# Patient Record
Sex: Female | Born: 1955
Health system: Southern US, Community
[De-identification: ages and names within clinical notes are randomized; demographics above are authoritative.]

## PROBLEM LIST (undated history)

## (undated) DIAGNOSIS — I10 Essential (primary) hypertension: Secondary | ICD-10-CM

## (undated) DIAGNOSIS — E785 Hyperlipidemia, unspecified: Secondary | ICD-10-CM

## (undated) DIAGNOSIS — E669 Obesity, unspecified: Secondary | ICD-10-CM

## (undated) DIAGNOSIS — M1990 Unspecified osteoarthritis, unspecified site: Secondary | ICD-10-CM

## (undated) DIAGNOSIS — E079 Disorder of thyroid, unspecified: Secondary | ICD-10-CM

## (undated) HISTORY — PX: LUMBAR FUSION: SHX111

## (undated) HISTORY — PX: EYE SURGERY: SHX253

## (undated) HISTORY — DX: Disorder of thyroid, unspecified: E07.9

## (undated) HISTORY — DX: Obesity, unspecified: E66.9

## (undated) HISTORY — DX: Essential (primary) hypertension: I10

## (undated) HISTORY — DX: Unspecified osteoarthritis, unspecified site: M19.90

## (undated) HISTORY — DX: Hyperlipidemia, unspecified: E78.5

## (undated) HISTORY — PX: FOOT SURGERY: SHX648

---

## 1997-08-08 ENCOUNTER — Other Ambulatory Visit: Admission: RE | Admit: 1997-08-08 | Discharge: 1997-08-08 | Payer: Self-pay | Admitting: Family Medicine

## 1997-08-12 ENCOUNTER — Ambulatory Visit (HOSPITAL_COMMUNITY): Admission: RE | Admit: 1997-08-12 | Discharge: 1997-08-12 | Payer: Self-pay | Admitting: Podiatry

## 1998-09-26 ENCOUNTER — Other Ambulatory Visit: Admission: RE | Admit: 1998-09-26 | Discharge: 1998-09-26 | Payer: Self-pay | Admitting: Family Medicine

## 1999-04-09 ENCOUNTER — Encounter: Payer: Self-pay | Admitting: Neurosurgery

## 1999-04-11 ENCOUNTER — Encounter: Payer: Self-pay | Admitting: Neurosurgery

## 1999-04-12 ENCOUNTER — Inpatient Hospital Stay (HOSPITAL_COMMUNITY): Admission: RE | Admit: 1999-04-12 | Discharge: 1999-04-14 | Payer: Self-pay | Admitting: Neurosurgery

## 1999-05-10 ENCOUNTER — Ambulatory Visit (HOSPITAL_COMMUNITY): Admission: RE | Admit: 1999-05-10 | Discharge: 1999-05-10 | Payer: Self-pay | Admitting: Neurosurgery

## 1999-05-10 ENCOUNTER — Encounter: Payer: Self-pay | Admitting: Neurosurgery

## 1999-07-25 ENCOUNTER — Ambulatory Visit (HOSPITAL_COMMUNITY): Admission: RE | Admit: 1999-07-25 | Discharge: 1999-07-25 | Payer: Self-pay | Admitting: Neurosurgery

## 1999-07-25 ENCOUNTER — Encounter: Payer: Self-pay | Admitting: Neurosurgery

## 1999-08-29 ENCOUNTER — Other Ambulatory Visit: Admission: RE | Admit: 1999-08-29 | Discharge: 1999-08-29 | Payer: Self-pay | Admitting: Emergency Medicine

## 1999-10-16 ENCOUNTER — Encounter: Admission: RE | Admit: 1999-10-16 | Discharge: 1999-10-16 | Payer: Self-pay | Admitting: Family Medicine

## 1999-10-16 ENCOUNTER — Encounter: Payer: Self-pay | Admitting: Family Medicine

## 2001-02-23 ENCOUNTER — Encounter: Payer: Self-pay | Admitting: Family Medicine

## 2001-02-23 ENCOUNTER — Encounter: Admission: RE | Admit: 2001-02-23 | Discharge: 2001-02-23 | Payer: Self-pay | Admitting: Family Medicine

## 2001-04-23 ENCOUNTER — Encounter: Admission: RE | Admit: 2001-04-23 | Discharge: 2001-04-23 | Payer: Self-pay | Admitting: Family Medicine

## 2001-04-23 ENCOUNTER — Encounter: Payer: Self-pay | Admitting: Family Medicine

## 2001-05-05 ENCOUNTER — Encounter (INDEPENDENT_AMBULATORY_CARE_PROVIDER_SITE_OTHER): Payer: Self-pay | Admitting: Specialist

## 2001-05-05 ENCOUNTER — Ambulatory Visit (HOSPITAL_COMMUNITY): Admission: RE | Admit: 2001-05-05 | Discharge: 2001-05-05 | Payer: Self-pay | Admitting: *Deleted

## 2002-10-11 ENCOUNTER — Ambulatory Visit (HOSPITAL_COMMUNITY): Admission: RE | Admit: 2002-10-11 | Discharge: 2002-10-11 | Payer: Self-pay | Admitting: Gastroenterology

## 2003-08-01 ENCOUNTER — Other Ambulatory Visit: Admission: RE | Admit: 2003-08-01 | Discharge: 2003-08-01 | Payer: Self-pay | Admitting: Family Medicine

## 2003-08-15 ENCOUNTER — Encounter: Admission: RE | Admit: 2003-08-15 | Discharge: 2003-08-15 | Payer: Self-pay | Admitting: Family Medicine

## 2004-08-08 ENCOUNTER — Other Ambulatory Visit: Admission: RE | Admit: 2004-08-08 | Discharge: 2004-08-08 | Payer: Self-pay | Admitting: Family Medicine

## 2004-10-09 ENCOUNTER — Encounter: Admission: RE | Admit: 2004-10-09 | Discharge: 2004-10-09 | Payer: Self-pay | Admitting: Family Medicine

## 2005-09-03 ENCOUNTER — Other Ambulatory Visit: Admission: RE | Admit: 2005-09-03 | Discharge: 2005-09-03 | Payer: Self-pay | Admitting: Family Medicine

## 2005-10-11 ENCOUNTER — Encounter: Admission: RE | Admit: 2005-10-11 | Discharge: 2005-10-11 | Payer: Self-pay | Admitting: Family Medicine

## 2006-10-13 ENCOUNTER — Encounter: Admission: RE | Admit: 2006-10-13 | Discharge: 2006-10-13 | Payer: Self-pay | Admitting: Family Medicine

## 2006-10-31 ENCOUNTER — Other Ambulatory Visit: Admission: RE | Admit: 2006-10-31 | Discharge: 2006-10-31 | Payer: Self-pay | Admitting: Family Medicine

## 2006-11-25 ENCOUNTER — Encounter: Admission: RE | Admit: 2006-11-25 | Discharge: 2006-11-25 | Payer: Self-pay | Admitting: Neurosurgery

## 2007-01-27 ENCOUNTER — Inpatient Hospital Stay (HOSPITAL_COMMUNITY): Admission: RE | Admit: 2007-01-27 | Discharge: 2007-01-29 | Payer: Self-pay | Admitting: Neurosurgery

## 2007-07-02 ENCOUNTER — Encounter: Admission: RE | Admit: 2007-07-02 | Discharge: 2007-07-02 | Payer: Self-pay | Admitting: Neurosurgery

## 2007-10-23 ENCOUNTER — Encounter: Admission: RE | Admit: 2007-10-23 | Discharge: 2007-10-23 | Payer: Self-pay | Admitting: Family Medicine

## 2007-12-25 ENCOUNTER — Other Ambulatory Visit: Admission: RE | Admit: 2007-12-25 | Discharge: 2007-12-25 | Payer: Self-pay | Admitting: Family Medicine

## 2009-01-11 ENCOUNTER — Encounter: Admission: RE | Admit: 2009-01-11 | Discharge: 2009-01-11 | Payer: Self-pay | Admitting: Family Medicine

## 2009-01-25 ENCOUNTER — Other Ambulatory Visit: Admission: RE | Admit: 2009-01-25 | Discharge: 2009-01-25 | Payer: Self-pay | Admitting: Family Medicine

## 2010-02-01 ENCOUNTER — Encounter
Admission: RE | Admit: 2010-02-01 | Discharge: 2010-02-01 | Payer: Self-pay | Source: Home / Self Care | Attending: Family Medicine | Admitting: Family Medicine

## 2010-05-29 NOTE — Op Note (Signed)
NAME:  Margaret Hancock, Margaret Hancock                ACCOUNT NO.:  [id number]   MEDICAL RECORD NO.:  [id number]          PATIENT TYPE:  INP   LOCATION:  3172                         FACILITY:  [facility]   PHYSICIAN:  Sherilyn Cooter A. Pool, M.D.    DATE OF BIRTH:  [birth year]   DATE OF PROCEDURE:  01/27/2007  DATE OF DISCHARGE:                               OPERATIVE REPORT   PREOPERATIVE DIAGNOSIS:  L3-4 degenerative disk disease with  spondylolisthesis and lateral listhesis representing instability, L3-4  stenosis.  Status post L4-5 posterolateral arthrodesis with  instrumentation and L4-5 posterior lumbar interbody fusion.   POSTOPERATIVE DIAGNOSIS:  L3-4 degenerative disk disease with  spondylolisthesis and lateral listhesis representing instability, L3-4  stenosis.  Status post L4-5 posterolateral arthrodesis with  instrumentation and L4-5 posterior lumbar interbody fusion.   PROCEDURE NOTE:  Re-exploration of L4-5 fusion with removal of L4 and L5  hardware.  L3-4 re-exploration of laminectomy with complete  decompressive laminectomy including bilateral L3-L4 decompressive  foraminotomies, more than would be required for simple interbody fusion  alone.  L3-4 posterior lumbar utilizing tangent interbody allograft  wedge,Telamon interbody PEEK cage and local autografting.  L3-4  posterolateral arthrodesis utilizing nonsegmental pedicle  instrumentation and local autografting.   SURGEON:  Kathaleen Maser. Pool, M.D.   ASSISTANT:  Tia Alert, M.D.   ANESTHESIA:  General endotracheal.   PREMEDICATION:  Ms. Orson Slick is a 55 year old female status post previous  L4-5 decompression and fusion approximately eight years ago.  The  patient presents now with a intractable back pain with lower extremity  symptoms.  Patient has failed all conservative management.  She has  demonstrated evidence of instability at the level above her fusion.  She  presents now for decompression and fusion at L3-4 level.  This will  require  hardware removal and re-exploration of her previous fusion.   OPERATIVE NOTE:  The patient placed on the operative table in the supine  position. Adequate level of anesthesia was achieved.  The patient was  positioned prone onto a Wilson frame, appropriately padded.  The  patient's lumbar region was prepped and draped sterilely.  A 10 blade  was used to make a linear skin incision overlying the L3, L4 and L5  levels.  This was carried down sharply in the midline.  A subperiosteal  dissection was then performed exposing lamina and facet joint at L2-3  and the transverse processes of L3, the posterolateral fusion of L4-5  including instrumentation.  The previously placed STRS Instrumentation  was then disassembled.  The pedicle screws were removed.  The fusion at  L4-5 was explored and found to be solid.  Screws were left out at L5.  A  6.75 x 40-mm radius screws were placed bilaterally at L4 through the  previously made pedicle screw holes.  Decompressive laminectomy was then  performed at L3-4.  The patient's previous laminectomy defect was  dissected free.  Complete laminectomy of L3 was then performed using  Leksell rongeurs, Kerrison rongeurs, and high-speed drill.  All elements  of the lamina were completely removed.  Complete inferior facetectomies  were  performed bilaterally at L3.  Complete superior facetectomies of L4  were performed bilaterally.  All bone was cleaned and used in later  autograft.  Underlying thecal sac and exiting L3-L4 nerve roots were  identified.  Wide decompressive foraminotomies were then performed along  the course of the exiting nerve roots.  Epidural venous plexus was  coagulated and cut. __________  patient's left side.  Thecal sac and  nerve roots gently mobilized and retracted towards midline.  Disk space  was then incised with a 15 blade in rectangular fashion.  Wide disk  space clean out was achieved using pituitary rongeurs, upbiting  pituitary  rongeurs and Epstein curettes.  Procedure was then repeated on  the contralateral side.  Disk space was then sequentially dilated up to  8 mm with an 8-mm distractor left in the patient's left side.  Thecal  sac and nerve root was retracted on the right side.  Disk space then  reamed and then cut with 8-mm tangent instrument.  Soft tissues removed  from the interspace.  A 8n x 22-mm Telamon cage packed with morselized  autograft and Progenix putty was then impacted into place and recessed  approximately 2 mm the posterior cortical margin of L3.  The thecal sac  and nerve roots were protected on the left side.  Disk space was then  reamed and cut with 8-mm tangent instruments.  Soft tissue removed from  the interspace.  Disk space was further curettaged.  Morselized  autograft was then packed into the interspace.  An 8 x 26-mm tangent  wedge then impacted into place and recessed roughly 1-mm from the  posterior vertical margin of L3.  Pedicles at L3 were then identified  using surface landmarks and intraoperative fluoroscopy.  Superficial  bone around the pedicle was then removed using high-speed drill.  Each  pedicle was then probed using pedicle awl.  An awl track was then tapped  with a 5.25-mm screw tapper.  Each screw tap hole was probed and found  be solid in bone.  A 6.75 x 45-mm radius screws were placed bilaterally  at L3.  Transverse processes of L3 and L4 were then decorticated using a  high speed drill.  Morselized autograft was packed posterolaterally for  later fusion.  Short segment titanium rod was then placed over the screw  heads at L4 and L5.  Locking caps were placed over the screw heads.  The  locking caps were then engaged with construct under compression.  Final  images revealed good position of the bone grafts and hardware at proper  operative level with normal alignment of spine.  Wound was then  irrigated one final time.  Gelfoam was placed topically for  hemostasis.  A medium Hemovac drain was left as a temporary spacer.  Wound was then  closed in layers with Vicryl suture.  Steri-Strips and sterile dressing  were applied.  There were no complications.  The patient tolerated the  procedure well, and she returned to the recovery room for postoperative  care.           ______________________________  Kathaleen Maser. Pool, M.D.     HAP/MEDQ  D:  01/27/2007  T:  01/27/2007  Job:  [id number]

## 2010-06-01 NOTE — Op Note (Signed)
[facility]  Patient:    Margaret Hancock, Margaret Hancock Visit Number: [id number] MRN: [id number]          Service Type: DSU Location: [facility] Attending Physician:  Ardeen Fillers Dictated by:   Sung Amabile. Roslyn Smiling, M.D. Proc. Date: 05/05/01 Admit Date:  05/05/2001                             Operative Report  INDICATIONS:                  This 55 year old woman G 1, P 1, with continued bleeding since December.  Menses have been irregular over the past year.  She reports a pre-menopausal FSH level which was drawn in December 2002.  She is hypothyroid, but recent thyroid study tests were normal.  An ultrasound at [facility] recently showed an endometrial stripe of 1.1 cm, and no endometrial mass.  She has had no anemia.  She was given 10 days of Provera recently, and had heavier bleeding, but the bleeding has not abated.  Attempts to do a biopsy of the endometrium in the office were unsuccessful, because of cervical stenosis and also obstruction of the cervical os by a large peduculated Nabothian cyst. She is admitted now for a D&C and excision of the cyst.  PREOPERATIVE DIAGNOSES:       1. Abnormal uterine bleeding, with cervical                               stenosis.                               2. Large nabothian cyst, obstructing the                               cervical os.  POSTOPERATIVE DIAGNOSES:      1. Abnormal uterine bleeding, with cervical                               stenosis.                               2. Large nabothian cyst, obstructing the                               cervical os.  PROCEDURE:                    1. Dilatation and curettage.                               2. Excision of nabothian cyst.  SURGEON:                      Sung Amabile. Roslyn Smiling, M.D.  ANESTHESIA:                   General.  Anesthesia via LMA, paracervical block.  ESTIMATED BLOOD LOSS:         Less than 50 cc.  TUBES/DRAINS:  None.  COMPLICATIONS:                 None.  FINDINGS:                     The uterus was anteverted, normal size, nontender.  The uterine cavity sounded to 8.0 cm.  The os slightly stenotic. A 2.0 cm nabothian cyst, pedunculated, obstructing the os.  Some irregularity of the posterior wall, curettage.  SPECIMENS:                    Endometrial curettings to pathology.  DESCRIPTION OF PROCEDURE:     After the establishment of general anesthesia, the patient was placed in the dorsal lithotomy position.  The perineum and vagina were prepped with Betadine solution.  The bladder was evacuated.  the patient was draped.  Examination under anesthesia for the above findings was carried out.  No adnexal masses were noted.  The Graves speculum was inserted in vagina.  The cervix was reprepped with Betadine solution.  The anterior cervical lip was infiltrated with 1% Xylocaine.  A paracervical block was placed in the usual fashion with 20 cc of 1% Xylocaine.  The uterus was sounded to 8.0 cm.  The cervix was dilated using Pratt dilators to a 27-French.  Sharp curettage was carried out.  The large nabothian cyst was identified and grasped.  It was grasped at the base with two right angle clamps and then excised sharply.  The defect was closed with two interrupted sutures of #0 Vicryl.  Hemostasis was noted.  The instruments were removed.  The patient was returned to the supine position, extubate without difficulty, and transported to the recovery room in satisfactory condition. Dictated by:   Sung Amabile Roslyn Smiling, M.D. Attending Physician:  Ardeen Fillers DD:  05/05/01 TD:  05/05/01 Job: [id number]

## 2010-06-01 NOTE — Op Note (Signed)
   NAME:  Margaret Hancock, Margaret Hancock                          ACCOUNT NO.:  [id number]   MEDICAL RECORD NO.:  [id number]                   PATIENT TYPE:  AMB   LOCATION:  ENDO                                 FACILITY:  [facility]   PHYSICIAN:  John C. Madilyn Fireman, M.D.                 DATE OF BIRTH:  Dec 25, 1955   DATE OF PROCEDURE:  10/11/2002  DATE OF DISCHARGE:                                 OPERATIVE REPORT   PROCEDURE:  Colonoscopy.   INDICATIONS FOR PROCEDURE:  Family history of colon cancer in a first degree  relative.   DESCRIPTION OF PROCEDURE:  The patient was placed in the left lateral  decubitus position then placed on the pulse monitor with continuous low flow  oxygen delivered by nasal cannula. She was sedated with 75 mcg IV fentanyl  and 8 mg Versed. The Olympus video colonoscope was inserted into the rectum  and advanced to the cecum, confirmed by transillumination at McBurney's  point and visualization of the ileocecal valve and appendiceal orifice. The  prep was excellent. The cecum, ascending, transverse, descending and sigmoid  colon all appeared normal with no masses, polyps, diverticula or other  mucosal abnormalities. The rectum likewise appeared normal and retroflexed  view of the anus revealed no obvious internal hemorrhoids. The scope was  then withdrawn and the patient returned to the recovery room in stable  condition. She tolerated the procedure well and there were no immediate  complications.   IMPRESSION:  Normal colonoscopy.   PLAN:  Repeat study in five years.                                               John C. Madilyn Fireman, M.D.    JCH/MEDQ  D:  10/11/2002  T:  10/11/2002  Job:  [id number]   cc:   Otilio Connors. Gerri Spore, M.D.  [address]  [place]  [place] [address]  Fax: [phone]

## 2010-06-01 NOTE — Op Note (Signed)
[place]  Patient:    Margaret Hancock, Margaret Hancock                       MRN: [id number] Proc. Date: 04/11/99 Adm. Date:  [id number] Attending:  Donn Pierini                           Operative Report  PREOPERATIVE DIAGNOSIS:  Right L4-5 synovial cyst.  L4-5 chronic instability.  PROCEDURE:  L4-5 decompressive laminectomy with resection of a synovial cyst. Bilateral L4-5 foraminotomies.  Posterior lumbar interbody fusion utilizing Tangent threaded bone wedges and local autograft.  Posterolateral fusion with pedicle screw instrumentation and local autograft.   Microdissection.  SURGEON:  Julio Sicks, M.D.  ASSISTANT:  Reinaldo Meeker, M.D.  ANESTHESIA:  General endotracheal.  INDICATIONS:  Ms. Margaret Hancock is a 55 year old female with history of back and right lower extremity pain consistent with a right L5 radiculopathy, which has failed  efforts at conservative management.  MRI scanning demonstrates a right-sided L5  nerve root synovial cyst.  There is also evidence of high ______ within both facet joints.  There were ______ changes in the disk space at L4-5.  The patient has been counselled as to her options.  We discussed the possibility of undergoing simple decompression with resection of the cyst versus decompression and fusion.  I have explained there is a relatively high recurrence rate with simple decompression alone.  She has opted for the more definitive procedure and has decided to proceed with lumbar decompression and fusion.  OPERATIVE NOTE:  Patient taken to the operating room, placed on operating table in supine position.  After adequate level of anesthesia achieved, patient positioned prone onto a Wilson frame, appropriately padded.  The patients lumbar region was shaved and prepped sterilely.  A #10 blade was used to make a linear skin incision overlying the L3, L4 and L5 levels.  This carried down sharply in the  midline. A subperiosteal dissection then performed bilaterally, exposing the lamina and facet joints of L4 and L5, as well as, lamina and facet joints of L3-4.  The transverse processes of L4 and L5 were then dissected free.  A deep self-retaining retractor was placed.  Intraoperative fluoroscopy was used and the L4-5 level was confirmed. A decompressive laminectomy was then performed using Leksell rongeurs, high-speed drill and Kerrison rongeurs to remove the entire lamina at L4.  The superior one-third of the lamina of L5.  The ligamentum flavum between L3 and L4 was also resected with Kerrison rongeurs.  Wide medial facetectomy was then performed bilaterally using Leksell rongeurs and Kerrison rongeurs and high-speed drill to remove the entire inferior facet of L4 and the medial two-thirds of the L5 superior facet.  The underlying thecal sac and exiting L4 and L5 nerve roots were identified bilaterally.  The synovial cyst was then dissected free from the underlying thecal sac and exiting L5 nerve root.  This was done with microdissection under the microscope.  The synovial cyst was completely removed and the right L5 nerve root was totally decompressed.  Still using the microscope, the epidural venous plexus was coagulated and cut.  The thecal sac and exiting L5 nerve root were then mobilized and retracted towards the midline.  The disk space, starting first on the patients right side, was then incised with 15 blade in a rectangular fashion.  wide disk space clean out was then achieved using pituitary  rongeurs, upward-angled pituitary rongeurs and Epstein curettes.  A 10 mm distractor was then placed in the interspace and attention was then placed to the contralateral side.  Once again, the disk space was uncovered and the epidural venous plexus was coagulated and ut. Thecal sac and exiting L5 nerve root were retracted and protected towards the midline.  The disk space  was then incised and a wide, aggressive diskectomy was  once again performed.  An 11 mm distractor was then placed in this side.  A 10 m distractor was removed.  Starting on the patients right side, the disk space was then reamed and then cut with a medium chisel for appropriation of placement of the Tangent bone graft.  Disk space was cleaned and irrigated with antibiotic solution. A 10 x 20 mm Tangent wedge was then placed in the interspace.  Inserter was removed.  The distractor from the contralateral side was removed.  Attention was then placed to the left side.  Thecal sac and exiting L5 nerve root were once again protected, as was the exiting L4 nerve root above.  The disk space was then reamed and then cut with a medium chisel once again.  The disk space was completely scraped of all soft material.  Morcellized autograft was then packed into the interspace.  A 10 x 20 mm Tangent wedge was then impacted into place once again. Inserter was removed.  The wound was then copiously irrigated with antibiotic solution.  The exiting foramen were found to be free.  The final images with the fluoroscopy revealed good positioning of bone grafts in the L4-5 interspace with normal alignment of the spine.  Microscope was removed and preparation was made for placement of pedicle screw instrumentation.   The pedicles were isolated at L4 nd L5 bilaterally.  The superficial cortices were removed using Leksell rongeur and high-speed drill.  The pedicles were then probed using a pedicle awl.  Each pedicle was then tapped using a 5.25 mm screw tap.  All these maneuvers were made under  fluoroscopic guidance.  All screw tap holes were found to be solidly within bone using blunt probe.  A 6.75 x 40 mm variable headed SDRS screw was then placed at the L4 and L5 levels bilaterally.  The screws were placed under fluoroscopic guidance and found to be well positioned.  A short segment of titanium  rod was hen placed over the screw heads of L4 and L5.  Locking caps were then placed over the screw heads.  Locking caps were then engaged in a sequential fashion in order to place the construct under compression.  Final images of C-arm fluoroscopy revealed  good position of the bone grafts and hardware at the proper operative level with normal alignment of the spine.  This was done in both the lateral and A/P planes. The wound was then copiously irrigated with antibiotic solution.  Transverse processes of L4 and L5 were then decorticated using the high-speed drill. Morcellized autograft was then packed posterolaterally along the transverse processes and lateral facet joints.  The thecal sac and exiting L4 and L5 nerve  roots were inspected.  There was no evidence of any compression.  Gelfoam was placed topically for hemostasis, which is found to be good.  Retractor system was removed.  Hemostasis of muscle achieved with electrocautery.  A medium Hemovac as left in the epidural space.  The wound was then closed in layers with Vicryl sutures.  Staples were applied to the surface.  There were no apparent complications.  The patient tolerated the procedure well and she returns to recovery room postoperatively. DD:  04/11/99 TD:  04/11/99 Job: [id number]

## 2010-06-01 NOTE — H&P (Signed)
[facility]  PatientIVELISSE, [name] Visit Number: [id number] MRN: [id number]          Service Type: Attending:  Sung Amabile. Roslyn Smiling, M.D. Dictated by:   Sung Amabile Roslyn Smiling, M.D. Adm. Date:  05/05/01                           History and Physical  CHIEF COMPLAINT:              Abnormal uterine bleeding for four months. Nabothian cyst obstructing cervical os.  HISTORY OF PRESENT ILLNESS:   A 55 year old woman, G1, P1, with continual bleeding since December.  Menses have been irregular over the last year.  She reports that [facility] in the premenopausal range of December 2002.  She is hyperthyroid, but has recent thyroid studies that were normal.  Ultrasound at [facility] recently showed an endometrial strip of 1.1 cm and no endometrial mass. Hemoglobin has been good.  She was given 10 days of Provera recently and has had heavier bleeding, but it has not abated.  Attempts to biopsy her endometrium in the office were unsuccessful because of cervical stenosis and also because of obstruction of the cervical os by a large pedunculated Nabothian cyst.  She is admitted for [facility] and excision of the Nabothian cyst.  PAST MEDICAL HISTORY:         1. Hypothyroidism, for the last 1-1/2 years.                               2. Hypercholesterolemia.                               3. Hypertension.  SURGERIES:                    1. Lumbar disk surgery.                               2. Cesarean section, 1984.  MEDICATIONS:                  1. Bisoprl/HCTZ 25/6.25 mg q.d.                               2. Lipitor 10 mg q.d.                               3. Multivitamin.                               4. Levothyroid 75 mcg one tablet p.o. q.d.                               5. Fluoxetine 20 mg q.d.  ALLERGIES:                    None.  FAMILY HISTORY:               Mother has hypertension and died of an MI at age 79.  Father with CVA, hypertension and prostatic and colon cancer; died in  his 78s.  Siblings and child are alive and well.  SOCIAL HISTORY:               Married.  Professor at [employer].  Denies tobacco use.  Drinks alcohol occasionally.  PHYSICAL EXAMINATION:  VITAL SIGNS:                  Healthy-appearing woman, weight 213 pounds, height 5 feet 3 inches, blood pressure 110/68.  HEENT:                        Within normal limits.  NECK:                         Without thyromegaly.  CHEST:                        Clear.  CARDIAC:                      Regular rate and rhythm; S1, S2 normal.  BREASTS:                      Without mass or tenderness, axillary or supraclavicular nodes.  ABDOMEN:                      Soft and nontender, without organomegaly mass or hernia.  Well-healed Pfannenstiel incision.  BACK:                         Without CVA tenderness.  EXTREMITIES:                  No clubbing, cyanosis or edema.  GU:                           Vagina without lesion.  Heme in vaginal vault. Cervix:  Os obstructed by 2 cm pedunculated Nabothian cyst.  Uterus top normal-sized, anteverted and nontender.  Mobile.  Adnexa normal to palpation. Rectovaginal exam confirmatory.  SKIN:                         Without lesions.  NEUROLOGIC:                   Grossly intact.  LABS:                         Recent hemoglobin in 13 range.  ASSESSMENT/PLAN:              1. Abnormal uterine bleeding.                               2. Pedunculated nabothian cyst, obstructing                                  ability to perform endometrial biopsy.                               3. Hypertension.  4. Hypothyroid - euthyroid recently with                                  medication.                               5. Hypercholesterolemia.                               6. Obesity.  D&C and excision of nabothian cyst.  The patient has been counseled regarding the benefits, risks and options and expected outcome of both procedures  prior to surgery. Dictated by:   Sung Amabile Roslyn Smiling, M.D. Attending:  Sung Amabile. Roslyn Smiling, M.D. DD:  04/30/01 TD:  04/30/01 Job: [id number]

## 2010-10-04 LAB — BASIC METABOLIC PANEL
Calcium: 9.5
GFR calc Af Amer: >60
GFR calc non Af Amer: >60
Sodium: 138

## 2010-10-04 LAB — DIFFERENTIAL
Basophils Absolute: 0
Basophils Relative: 1
Eosinophils Relative: 1
Lymphocytes Relative: 29
Lymphs Abs: 1.6
Neutro Abs: 3.6

## 2010-10-04 LAB — PROTIME-INR
INR: 0.9
Prothrombin Time: 12.1

## 2010-10-04 LAB — TYPE AND SCREEN: Antibody Screen: NEGATIVE

## 2010-10-04 LAB — CBC
MCHC: 34.8
RBC: 4.42
RDW: 13.4
WBC: 5.5

## 2011-01-28 ENCOUNTER — Other Ambulatory Visit: Payer: Self-pay | Admitting: Family Medicine

## 2011-01-28 DIAGNOSIS — Z1231 Encounter for screening mammogram for malignant neoplasm of breast: Secondary | ICD-10-CM

## 2011-02-12 ENCOUNTER — Ambulatory Visit
Admission: RE | Admit: 2011-02-12 | Discharge: 2011-02-12 | Disposition: A | Discharge status: Home / Self Care | Payer: BC Managed Care – PPO | Source: Ambulatory Visit | Attending: Family Medicine | Admitting: Family Medicine

## 2011-02-12 DIAGNOSIS — Z1231 Encounter for screening mammogram for malignant neoplasm of breast: Secondary | ICD-10-CM

## 2012-01-20 ENCOUNTER — Other Ambulatory Visit: Payer: Self-pay | Admitting: Family Medicine

## 2012-01-20 DIAGNOSIS — Z1231 Encounter for screening mammogram for malignant neoplasm of breast: Secondary | ICD-10-CM

## 2012-02-14 ENCOUNTER — Ambulatory Visit
Admission: RE | Admit: 2012-02-14 | Discharge: 2012-02-14 | Disposition: A | Discharge status: Home / Self Care | Payer: BC Managed Care – PPO | Source: Ambulatory Visit | Attending: Family Medicine | Admitting: Family Medicine

## 2012-02-14 DIAGNOSIS — Z1231 Encounter for screening mammogram for malignant neoplasm of breast: Secondary | ICD-10-CM

## 2012-10-02 ENCOUNTER — Other Ambulatory Visit (HOSPITAL_COMMUNITY)
Admission: RE | Admit: 2012-10-02 | Discharge: 2012-10-02 | Disposition: A | Discharge status: Home / Self Care | Payer: BC Managed Care – PPO | Source: Ambulatory Visit | Attending: Family Medicine | Admitting: Family Medicine

## 2012-10-02 ENCOUNTER — Other Ambulatory Visit: Payer: Self-pay | Admitting: Family Medicine

## 2012-10-02 ENCOUNTER — Encounter: Payer: Self-pay | Admitting: Cardiology

## 2012-10-02 DIAGNOSIS — Z1151 Encounter for screening for human papillomavirus (HPV): Secondary | ICD-10-CM | POA: Insufficient documentation

## 2012-10-02 DIAGNOSIS — Z124 Encounter for screening for malignant neoplasm of cervix: Secondary | ICD-10-CM | POA: Insufficient documentation

## 2012-11-06 ENCOUNTER — Other Ambulatory Visit: Payer: Self-pay | Admitting: Family Medicine

## 2012-11-06 DIAGNOSIS — I10 Essential (primary) hypertension: Secondary | ICD-10-CM

## 2012-11-09 ENCOUNTER — Encounter: Payer: Self-pay | Admitting: Cardiology

## 2012-11-11 ENCOUNTER — Ambulatory Visit: Payer: BC Managed Care – PPO | Admitting: Cardiology

## 2012-11-11 DIAGNOSIS — I10 Essential (primary) hypertension: Secondary | ICD-10-CM | POA: Insufficient documentation

## 2012-11-11 DIAGNOSIS — M47817 Spondylosis without myelopathy or radiculopathy, lumbosacral region: Secondary | ICD-10-CM | POA: Insufficient documentation

## 2012-11-13 ENCOUNTER — Ambulatory Visit
Admission: RE | Admit: 2012-11-13 | Discharge: 2012-11-13 | Disposition: A | Discharge status: Home / Self Care | Payer: BC Managed Care – PPO | Source: Ambulatory Visit | Attending: Family Medicine | Admitting: Family Medicine

## 2012-11-13 DIAGNOSIS — I10 Essential (primary) hypertension: Secondary | ICD-10-CM

## 2012-11-16 ENCOUNTER — Ambulatory Visit (INDEPENDENT_AMBULATORY_CARE_PROVIDER_SITE_OTHER): Payer: BC Managed Care – PPO | Admitting: Cardiology

## 2012-11-16 ENCOUNTER — Encounter: Payer: Self-pay | Admitting: Cardiology

## 2012-11-16 VITALS — BP 156/97 | HR 94 | Ht 62.5 in | Wt 220.0 lb

## 2012-11-16 DIAGNOSIS — I10 Essential (primary) hypertension: Secondary | ICD-10-CM

## 2012-11-16 DIAGNOSIS — R0681 Apnea, not elsewhere classified: Secondary | ICD-10-CM

## 2012-11-16 DIAGNOSIS — R0609 Other forms of dyspnea: Secondary | ICD-10-CM

## 2012-11-16 DIAGNOSIS — R0683 Snoring: Secondary | ICD-10-CM

## 2012-11-16 NOTE — Progress Notes (Signed)
HPI The patient presents for evaluation of difficult to control hypertension. She's had some blood pressure issues for quite a while but has had a recent exacerbation of her blood pressure and it has been more difficult to control. She was taking Ziac but was recently changed to amlodipine with hydrochlorothiazide. Her blood pressures have been running consistently in the 180s and sometimes higher with diastolics in the 90s and sometimes higher. More recently it started to come down a little bit. She did have a renal ultrasound and I reviewed these results and this was normal. She has not had any other dramatic change in her health although there's been a little more stress with a sick mother-in-law. She has her thyroid checked routinely and this has been normal. She has had no dramatic change in her weight. She does exercise although she hasn't been able to do this frequently because of some leg pain. She was trainer in an elliptical. She denies any chest pressure, neck or arm discomfort. She's had no palpitations, presyncope or syncope. She has had no PND or orthopnea. She does snore and wakes up not feeling rested.  Allergies  Allergen Reactions  . Anesthetics, Ester Nausea Only    Laughing gas    Current Outpatient Prescriptions  Medication Sig Dispense Refill  . aspirin 81 MG tablet Take 81 mg by mouth daily.      . calcium carbonate (OS-CAL) 600 MG TABS tablet Take 600 mg by mouth 2 (two) times daily with a meal.      . dimenhyDRINATE (DRAMAMINE) 50 MG tablet Take 50 mg by mouth every 8 (eight) hours as needed.      . hydrochlorothiazide (MICROZIDE) 12.5 MG capsule Take 12.5 mg by mouth daily.      Marland Kitchen levothyroxine (SYNTHROID, LEVOTHROID) 125 MCG tablet Take 125 mcg by mouth daily before breakfast.      . Multiple Vitamin (MULTIVITAMIN) tablet Take 1 tablet by mouth daily.      Marland Kitchen omeprazole (PRILOSEC) 20 MG capsule Take 20 mg by mouth daily.      . simvastatin (ZOCOR) 80 MG tablet Take 80  mg by mouth at bedtime.      . traMADol (ULTRAM) 50 MG tablet Take 50 mg by mouth every 6 (six) hours as needed for pain.      . vitamin E 1000 UNIT capsule Take 1,000 Units by mouth daily.       No current facility-administered medications for this visit.    Past Medical History  Diagnosis Date  . Hyperlipidemia   . Thyroid disease   . Hypertension   . Obesity   . Osteoarthritis   . OA (osteoarthritis)     No past surgical history on file.  No family history on file.  History   Social History  . Marital Status: Married    Spouse Name: N/A    Number of Children: N/A  . Years of Education: N/A   Occupational History  . Not on file.   Social History Main Topics  . Smoking status: Never Smoker   . Smokeless tobacco: Not on file  . Alcohol Use: Yes  . Drug Use: Not on file  . Sexual Activity: Yes   Other Topics Concern  . Not on file   Social History Narrative  . No narrative on file    ROS:  As stated in the HPI and negative for all other systems.   PHYSICAL EXAM There were no vitals taken for this visit. GENERAL:  Well appearing HEENT:  Pupils equal round and reactive, fundi not visualized, oral mucosa unremarkable NECK:  No jugular venous distention, waveform within normal limits, carotid upstroke brisk and symmetric, no bruits, no thyromegaly LYMPHATICS:  No cervical, inguinal adenopathy LUNGS:  Clear to auscultation bilaterally BACK:  No CVA tenderness CHEST:  Unremarkable HEART:  PMI not displaced or sustained,S1 and S2 within normal limits, no S3, no S4, no clicks, no rubs, no murmurs ABD:  Flat, positive bowel sounds normal in frequency in pitch, no bruits, no rebound, no guarding, no midline pulsatile mass, no hepatomegaly, no splenomegaly EXT:  2 plus pulses throughout, no edema, no cyanosis no clubbing SKIN:  No rashes no nodules NEURO:  Cranial nerves II through XII grossly intact, motor grossly intact throughout PSYCH:  Cognitively intact,  oriented to person place and time   EKG:  Normal sinus rhythm, rate 85, axis within normal limits, intervals within normal limits, no acute ST-T wave changes.  11/16/2012   ASSESSMENT AND PLAN  HTN:  I don't strongly suspect a secondary etiology. She could however have sleep apnea and this will be worked up as below. Because she had a recent change her medications I won't make a change today but she will keep a blood pressure diary. We discussed specific therapeutic lifestyle changes to include weight loss, increase activity as tolerated, decreased alcohol and salt intake. If she can employ these we should see the blood pressure come down on the current regimen. I will see her back in one month however to followup on this.  SNORING:  I suspect she has sleep apnea. I will set her up for a sleep study.  TACHYCARDIA:  She describes her heart beating hard but not fast. She does think the heart rate overall is higher than it used to be. I will consider following up with a monitor in the future. I will consider 24 hour urine for VMA and metanephrine if blood pressure doesn't come down.

## 2012-11-16 NOTE — Patient Instructions (Signed)
The current medical regimen is effective;  continue present plan and medications.  Your physician has recommended that you have a sleep study. This test records several body functions during sleep, including: brain activity, eye movement, oxygen and carbon dioxide blood levels, heart rate and rhythm, breathing rate and rhythm, the flow of air through your mouth and nose, snoring, body muscle movements, and chest and belly movement.  Follow up with Dr Antoine Poche in 1 month.

## 2012-11-17 DIAGNOSIS — I10 Essential (primary) hypertension: Secondary | ICD-10-CM | POA: Insufficient documentation

## 2012-12-14 ENCOUNTER — Ambulatory Visit (HOSPITAL_BASED_OUTPATIENT_CLINIC_OR_DEPARTMENT_OTHER): Payer: BC Managed Care – PPO | Attending: Cardiology

## 2012-12-14 VITALS — Ht 62.0 in | Wt 220.0 lb

## 2012-12-14 DIAGNOSIS — R0681 Apnea, not elsewhere classified: Secondary | ICD-10-CM

## 2012-12-14 DIAGNOSIS — R0683 Snoring: Secondary | ICD-10-CM

## 2012-12-14 DIAGNOSIS — G4733 Obstructive sleep apnea (adult) (pediatric): Secondary | ICD-10-CM

## 2012-12-20 DIAGNOSIS — R0609 Other forms of dyspnea: Secondary | ICD-10-CM

## 2012-12-20 DIAGNOSIS — R0681 Apnea, not elsewhere classified: Secondary | ICD-10-CM

## 2012-12-20 DIAGNOSIS — G4733 Obstructive sleep apnea (adult) (pediatric): Secondary | ICD-10-CM

## 2012-12-20 DIAGNOSIS — R0989 Other specified symptoms and signs involving the circulatory and respiratory systems: Secondary | ICD-10-CM

## 2012-12-20 NOTE — Sleep Study (Signed)
[facility]  NAME: Margaret Hancock DATE OF BIRTH:  [birth year] MEDICAL RECORD NUMBER [id number]  LOCATION: [facility]  PHYSICIAN: Barbaraann Share  DATE OF STUDY: 12/14/2012  SLEEP STUDY TYPE: Nocturnal Polysomnogram               REFERRING PHYSICIAN: Rollene Rotunda, MD  INDICATION FOR STUDY: hypersomnia with sleep apnea  EPWORTH SLEEPINESS SCORE:  9 HEIGHT: 5\' 2"  (157.5 cm)  WEIGHT: 220 lb (99.791 kg)    Body mass index is 40.23 kg/(m^2).  NECK SIZE: 14 in.  MEDICATIONS:   SLEEP ARCHITECTURE: The patient had a total sleep time of 212 minutes, with no slow-wave sleep and only 44 minutes of REM. Sleep onset latency was normal at 25 minutes, and REM onset was normal as well. Sleep efficiency was poor at 58%.  RESPIRATORY DATA: The patient had one obstructive apnea, and 57 obstructive hypopneas, giving him an AHI of 17 events per hour. The events occurred in all body positions, and there was loud snoring noted throughout. The patient did not meet split-night protocol secondary to the majority of his events occurring well after 1 AM.  OXYGEN DATA: There was oxygen desaturation as low as 78% with the patient's obstructive events  CARDIAC DATA: Occasional PAC noted, but no clinically significant arrhythmias were seen  MOVEMENT/PARASOMNIA: No significant leg jerks or other behavioral abnormalities were noted.  IMPRESSION/ RECOMMENDATION:    1) mild to moderate obstructive sleep apnea, with an AHI of 17 events per hour and oxygen desaturation as low as 78%. Treatment for this degree of sleep apnea should focus on a trial of weight loss alone, upper airway surgery, dental appliance, and finally CPAP. Clinical correlation is suggested.  2) occasional PAC noted, but no clinically significant arrhythmias were seen.     Barbaraann Share Diplomate, American Board of Sleep Medicine  ELECTRONICALLY SIGNED ON:  12/20/2012, 4:20 PM [facility] PH: [phone]   FX: [phone] ACCREDITED BY THE AMERICAN ACADEMY OF SLEEP MEDICINE

## 2012-12-29 ENCOUNTER — Other Ambulatory Visit: Payer: Self-pay | Admitting: Gastroenterology

## 2012-12-31 ENCOUNTER — Ambulatory Visit (INDEPENDENT_AMBULATORY_CARE_PROVIDER_SITE_OTHER): Payer: BC Managed Care – PPO | Admitting: Cardiology

## 2012-12-31 ENCOUNTER — Encounter: Payer: Self-pay | Admitting: Cardiology

## 2012-12-31 ENCOUNTER — Encounter (INDEPENDENT_AMBULATORY_CARE_PROVIDER_SITE_OTHER): Payer: Self-pay

## 2012-12-31 VITALS — BP 136/84 | HR 86 | Ht 62.5 in | Wt 217.6 lb

## 2012-12-31 DIAGNOSIS — I10 Essential (primary) hypertension: Secondary | ICD-10-CM

## 2012-12-31 NOTE — Progress Notes (Signed)
HPI The patient presents for evaluation of difficult to control hypertension. Since I last saw her the stress in her household has gone down. She was caring for her mother-in-law who is quite ill.  However, she died.  This was a sad event but they are easing back back into a routine in the stress level is decreased. She has tracked her blood pressure it also has gone down. She's not feeling of pounding in her heart any longer. She denies any chest pressure, neck or arm discomfort. She's having no shortness of breath, PND or orthopnea. She has had no weight gain or edema.  Allergies  Allergen Reactions  . Anesthetics, Ester Nausea Only    Laughing gas    Current Outpatient Prescriptions  Medication Sig Dispense Refill  . acetaminophen (TYLENOL) 500 MG tablet Take 500 mg by mouth as needed for pain.      Marland Kitchen amLODipine (NORVASC) 10 MG tablet Take 10 mg by mouth daily.      Marland Kitchen aspirin 81 MG tablet Take 81 mg by mouth daily.      Marland Kitchen CALCIUM-VITAMIN D PO Take 500 mg by mouth daily.      Marland Kitchen DIPHENHYDRAMINE HCL, SLEEP, PO Take by mouth. Take one tablet of Zquil daily for sleep      . hydrochlorothiazide (HYDRODIURIL) 25 MG tablet Take 25 mg by mouth daily.      Marland Kitchen levothyroxine (SYNTHROID, LEVOTHROID) 125 MCG tablet Take 125 mcg by mouth daily before breakfast.      . lisinopril (PRINIVIL,ZESTRIL) 10 MG tablet Take 10 mg by mouth daily.      . Multiple Vitamin (MULTIVITAMIN) tablet Take 1 tablet by mouth daily.      Marland Kitchen omeprazole (PRILOSEC) 20 MG capsule Take 20 mg by mouth daily.      . simvastatin (ZOCOR) 80 MG tablet Take 80 mg by mouth at bedtime.      . traMADol (ULTRAM) 50 MG tablet Take 50 mg by mouth every 8 (eight) hours as needed.       . vitamin E 400 UNIT capsule Take 400 Units by mouth daily.       No current facility-administered medications for this visit.    Past Medical History  Diagnosis Date  . Hyperlipidemia   . Thyroid disease   . Hypertension   . Obesity   . Osteoarthritis    . OA (osteoarthritis)     Past Surgical History  Procedure Laterality Date  . Lumbar fusion      x 2  . Foot surgery    . Cesarean section     ROS:  As stated in the HPI and negative for all other systems.   PHYSICAL EXAM BP 136/84  Pulse 86  Ht 5' 2.5" (1.588 m)  Wt 217 lb 9.6 oz (98.703 kg)  BMI 39.14 kg/m2 GENERAL:  Well appearing NECK:  No jugular venous distention, waveform within normal limits, carotid upstroke brisk and symmetric, no bruits, no thyromegaly LUNGS:  Clear to auscultation bilaterally CHEST:  Unremarkable HEART:  PMI not displaced or sustained,S1 and S2 within normal limits, no S3, no S4, no clicks, no rubs, no murmurs ABD:  Flat, positive bowel sounds normal in frequency in pitch, no bruits, no rebound, no guarding, no midline pulsatile mass, no hepatomegaly, no splenomegaly EXT:  2 plus pulses throughout, no edema, no cyanosis no clubbing   ASSESSMENT AND PLAN  HTN:  The blood pressure is at target. No change in medications is indicated. We will  continue with therapeutic lifestyle changes (TLC).  SNORING:  She had mild sleep apnea and we will treat this conservatively.   TACHYCARDIA:  This seems to have resolved with the reduced stress. No change in therapy is indicated.   RISK REDUCTION:  Her 10 year cardiovascular risk is less than 5%. I would not suggest an indication for aspirin based on this.  She needs primary risk reduction with lifestyle changes.

## 2012-12-31 NOTE — Patient Instructions (Signed)
Please stop your Asprin. Continue all other medications as needed.  Follow up as needed.

## 2013-02-15 ENCOUNTER — Other Ambulatory Visit (HOSPITAL_BASED_OUTPATIENT_CLINIC_OR_DEPARTMENT_OTHER): Payer: Self-pay | Admitting: Family Medicine

## 2013-02-15 DIAGNOSIS — Z1231 Encounter for screening mammogram for malignant neoplasm of breast: Secondary | ICD-10-CM

## 2013-02-22 ENCOUNTER — Ambulatory Visit (HOSPITAL_BASED_OUTPATIENT_CLINIC_OR_DEPARTMENT_OTHER)
Admission: RE | Admit: 2013-02-22 | Discharge: 2013-02-22 | Disposition: A | Discharge status: Home / Self Care | Payer: BC Managed Care – PPO | Source: Ambulatory Visit | Attending: Family Medicine | Admitting: Family Medicine

## 2013-02-22 DIAGNOSIS — Z1231 Encounter for screening mammogram for malignant neoplasm of breast: Secondary | ICD-10-CM | POA: Insufficient documentation

## 2013-06-18 DIAGNOSIS — M51369 Other intervertebral disc degeneration, lumbar region without mention of lumbar back pain or lower extremity pain: Secondary | ICD-10-CM | POA: Insufficient documentation

## 2013-06-18 DIAGNOSIS — M47816 Spondylosis without myelopathy or radiculopathy, lumbar region: Secondary | ICD-10-CM | POA: Insufficient documentation

## 2013-06-18 DIAGNOSIS — M1612 Unilateral primary osteoarthritis, left hip: Secondary | ICD-10-CM | POA: Insufficient documentation

## 2013-11-12 DIAGNOSIS — M25559 Pain in unspecified hip: Secondary | ICD-10-CM | POA: Insufficient documentation

## 2013-11-12 DIAGNOSIS — M25552 Pain in left hip: Secondary | ICD-10-CM | POA: Insufficient documentation

## 2014-06-06 ENCOUNTER — Other Ambulatory Visit (HOSPITAL_BASED_OUTPATIENT_CLINIC_OR_DEPARTMENT_OTHER): Payer: Self-pay | Admitting: Family Medicine

## 2014-06-06 DIAGNOSIS — Z1231 Encounter for screening mammogram for malignant neoplasm of breast: Secondary | ICD-10-CM

## 2014-06-10 ENCOUNTER — Ambulatory Visit (HOSPITAL_BASED_OUTPATIENT_CLINIC_OR_DEPARTMENT_OTHER)
Admission: RE | Admit: 2014-06-10 | Discharge: 2014-06-10 | Disposition: A | Discharge status: Home / Self Care | Payer: BC Managed Care – PPO | Source: Ambulatory Visit | Attending: Family Medicine | Admitting: Family Medicine

## 2014-06-10 DIAGNOSIS — Z1231 Encounter for screening mammogram for malignant neoplasm of breast: Secondary | ICD-10-CM | POA: Insufficient documentation

## 2014-08-09 NOTE — Patient Instructions (Addendum)
Margaret Hancock  08/09/2014   Your procedure is scheduled on: Tuesday 08/16/2014  Report to [facility] Main  Entrance take [place]  elevators to 3rd floor to  Short Stay Center at   0830  AM.  Call this number if you have problems the morning of surgery [phone]   Remember: ONLY 1 PERSON MAY GO WITH YOU TO SHORT STAY TO GET  READY MORNING OF YOUR SURGERY.  Do not eat food or drink liquids :After Midnight.     Take these medicines the morning of surgery with A SIP OF WATER:  Levothyroxine                               You may not have any metal on your body including hair pins and              piercings  Do not wear jewelry, make-up, lotions, powders or perfumes, deodorant             Do not wear nail polish.  Do not shave  48 hours prior to surgery.              Men may shave face and neck.   Do not bring valuables to the hospital. [facility] IS NOT             RESPONSIBLE   FOR VALUABLES.  Contacts, dentures or bridgework may not be worn into surgery.  Leave suitcase in the car. After surgery it may be brought to your room.     Patients discharged the day of surgery will not be allowed to drive home.  Name and phone number of your driver:  Special Instructions: N/A              Please read over the following fact sheets you were given: _____________________________________________________________________             [facility] - Preparing for Surgery Before surgery, you can play an important role.  Because skin is not sterile, your skin needs to be as free of germs as possible.  You can reduce the number of germs on your skin by washing with CHG (chlorahexidine gluconate) soap before surgery.  CHG is an antiseptic cleaner which kills germs and bonds with the skin to continue killing germs even after washing. Please DO NOT use if you have an allergy to CHG or antibacterial soaps.  If your skin becomes reddened/irritated stop using the CHG and inform your  nurse when you arrive at Short Stay. Do not shave (including legs and underarms) for at least 48 hours prior to the first CHG shower.  You may shave your face/neck. Please follow these instructions carefully:  1.  Shower with CHG Soap the night before surgery and the  morning of Surgery.  2.  If you choose to wash your hair, wash your hair first as usual with your  normal  shampoo.  3.  After you shampoo, rinse your hair and body thoroughly to remove the  shampoo.                           4.  Use CHG as you would any other liquid soap.  You can apply chg directly  to the skin and wash  Gently with a scrungie or clean washcloth.  5.  Apply the CHG Soap to your body ONLY FROM THE NECK DOWN.   Do not use on face/ open                           Wound or open sores. Avoid contact with eyes, ears mouth and genitals (private parts).                       Wash face,  Genitals (private parts) with your normal soap.             6.  Wash thoroughly, paying special attention to the area where your surgery  will be performed.  7.  Thoroughly rinse your body with warm water from the neck down.  8.  DO NOT shower/wash with your normal soap after using and rinsing off  the CHG Soap.                9.  Pat yourself dry with a clean towel.            10.  Wear clean pajamas.            11.  Place clean sheets on your bed the night of your first shower and do not  sleep with pets. Day of Surgery : Do not apply any lotions/deodorants the morning of surgery.  Please wear clean clothes to the hospital/surgery center.  FAILURE TO FOLLOW THESE INSTRUCTIONS MAY RESULT IN THE CANCELLATION OF YOUR SURGERY PATIENT SIGNATURE_________________________________  NURSE SIGNATURE__________________________________  ________________________________________________________________________   Adam Phenix  An incentive spirometer is a tool that can help keep your lungs clear and active. This tool  measures how well you are filling your lungs with each breath. Taking long deep breaths may help reverse or decrease the chance of developing breathing (pulmonary) problems (especially infection) following:  A long period of time when you are unable to move or be active. BEFORE THE PROCEDURE   If the spirometer includes an indicator to show your best effort, your nurse or respiratory therapist will set it to a desired goal.  If possible, sit up straight or lean slightly forward. Try not to slouch.  Hold the incentive spirometer in an upright position. INSTRUCTIONS FOR USE   Sit on the edge of your bed if possible, or sit up as far as you can in bed or on a chair.  Hold the incentive spirometer in an upright position.  Breathe out normally.  Place the mouthpiece in your mouth and seal your lips tightly around it.  Breathe in slowly and as deeply as possible, raising the piston or the ball toward the top of the column.  Hold your breath for 3-5 seconds or for as long as possible. Allow the piston or ball to fall to the bottom of the column.  Remove the mouthpiece from your mouth and breathe out normally.  Rest for a few seconds and repeat Steps 1 through 7 at least 10 times every 1-2 hours when you are awake. Take your time and take a few normal breaths between deep breaths.  The spirometer may include an indicator to show your best effort. Use the indicator as a goal to work toward during each repetition.  After each set of 10 deep breaths, practice coughing to be sure your lungs are clear. If you have an incision (the cut made at the time of surgery),  support your incision when coughing by placing a pillow or rolled up towels firmly against it. Once you are able to get out of bed, walk around indoors and cough well. You may stop using the incentive spirometer when instructed by your caregiver.  RISKS AND COMPLICATIONS  Take your time so you do not get dizzy or light-headed.  If  you are in pain, you may need to take or ask for pain medication before doing incentive spirometry. It is harder to take a deep breath if you are having pain. AFTER USE  Rest and breathe slowly and easily.  It can be helpful to keep track of a log of your progress. Your caregiver can provide you with a simple table to help with this. If you are using the spirometer at home, follow these instructions: [place] IF:   You are having difficultly using the spirometer.  You have trouble using the spirometer as often as instructed.  Your pain medication is not giving enough relief while using the spirometer.  You develop fever of 100.5 F (38.1 C) or higher. SEEK IMMEDIATE MEDICAL CARE IF:   You cough up bloody sputum that had not been present before.  You develop fever of 102 F (38.9 C) or greater.  You develop worsening pain at or near the incision site. MAKE SURE YOU:   Understand these instructions.  Will watch your condition.  Will get help right away if you are not doing well or get worse. Document Released: 05/13/2006 Document Revised: 03/25/2011 Document Reviewed: 07/14/2006 [employer] Patient Information 2014 [employer], [place].   ________________________________________________________________________  WHAT IS A BLOOD TRANSFUSION? Blood Transfusion Information  A transfusion is the replacement of blood or some of its parts. Blood is made up of multiple cells which provide different functions.  Red blood cells carry oxygen and are used for blood loss replacement.  White blood cells fight against infection.  Platelets control bleeding.  Plasma helps clot blood.  Other blood products are available for specialized needs, such as hemophilia or other clotting disorders. BEFORE THE TRANSFUSION  Who gives blood for transfusions?   Healthy volunteers who are fully evaluated to make sure their blood is safe. This is blood bank blood. Transfusion therapy is the  safest it has ever been in the practice of medicine. Before blood is taken from a donor, a complete history is taken to make sure that person has no history of diseases nor engages in risky social behavior (examples are intravenous drug use or sexual activity with multiple partners). The donor's travel history is screened to minimize risk of transmitting infections, such as malaria. The donated blood is tested for signs of infectious diseases, such as HIV and hepatitis. The blood is then tested to be sure it is compatible with you in order to minimize the chance of a transfusion reaction. If you or a relative donates blood, this is often done in anticipation of surgery and is not appropriate for emergency situations. It takes many days to process the donated blood. RISKS AND COMPLICATIONS Although transfusion therapy is very safe and saves many lives, the main dangers of transfusion include:   Getting an infectious disease.  Developing a transfusion reaction. This is an allergic reaction to something in the blood you were given. Every precaution is taken to prevent this. The decision to have a blood transfusion has been considered carefully by your caregiver before blood is given. Blood is not given unless the benefits outweigh the risks. AFTER THE TRANSFUSION  Right after receiving a blood transfusion, you will usually feel much better and more energetic. This is especially true if your red blood cells have gotten low (anemic). The transfusion raises the level of the red blood cells which carry oxygen, and this usually causes an energy increase.  The nurse administering the transfusion will monitor you carefully for complications. HOME CARE INSTRUCTIONS  No special instructions are needed after a transfusion. You may find your energy is better. Speak with your caregiver about any limitations on activity for underlying diseases you may have. SEEK MEDICAL CARE IF:   Your condition is not improving  after your transfusion.  You develop redness or irritation at the intravenous (IV) site. SEEK IMMEDIATE MEDICAL CARE IF:  Any of the following symptoms occur over the next 12 hours:  Shaking chills.  You have a temperature by mouth above 102 F (38.9 C), not controlled by medicine.  Chest, back, or muscle pain.  People around you feel you are not acting correctly or are confused.  Shortness of breath or difficulty breathing.  Dizziness and fainting.  You get a rash or develop hives.  You have a decrease in urine output.  Your urine turns a dark color or changes to pink, red, or brown. Any of the following symptoms occur over the next 10 days:  You have a temperature by mouth above 102 F (38.9 C), not controlled by medicine.  Shortness of breath.  Weakness after normal activity.  The white part of the eye turns yellow (jaundice).  You have a decrease in the amount of urine or are urinating less often.  Your urine turns a dark color or changes to pink, red, or brown. Document Released: 12/29/1999 Document Revised: 03/25/2011 Document Reviewed: 08/17/2007 [facility] Patient Information 2014 [place], [place].  _______________________________________________________________________

## 2014-08-10 ENCOUNTER — Encounter (HOSPITAL_COMMUNITY): Payer: Self-pay

## 2014-08-10 ENCOUNTER — Encounter (HOSPITAL_COMMUNITY)
Admission: RE | Admit: 2014-08-10 | Discharge: 2014-08-10 | Disposition: A | Discharge status: Home / Self Care | Payer: BC Managed Care – PPO | Source: Ambulatory Visit | Attending: Orthopedic Surgery | Admitting: Orthopedic Surgery

## 2014-08-10 DIAGNOSIS — Z01810 Encounter for preprocedural cardiovascular examination: Secondary | ICD-10-CM | POA: Insufficient documentation

## 2014-08-10 DIAGNOSIS — M1612 Unilateral primary osteoarthritis, left hip: Secondary | ICD-10-CM | POA: Insufficient documentation

## 2014-08-10 DIAGNOSIS — Z01812 Encounter for preprocedural laboratory examination: Secondary | ICD-10-CM | POA: Insufficient documentation

## 2014-08-10 LAB — CBC
HCT: 43 % (ref 36.0–46.0)
Hemoglobin: 14.1 g/dL (ref 12.0–15.0)
MCH: 30.8 pg (ref 26.0–34.0)
MCHC: 32.8 g/dL (ref 30.0–36.0)
MCV: 93.9 fL (ref 78.0–100.0)
PLATELETS: 196 10*3/uL (ref 150–400)
RBC: 4.58 MIL/uL (ref 3.87–5.11)
RDW: 12.9 % (ref 11.5–15.5)
WBC: 5.2 10*3/uL (ref 4.0–10.5)

## 2014-08-10 LAB — SURGICAL PCR SCREEN
MRSA, PCR: NEGATIVE
Staphylococcus aureus: POSITIVE — AB

## 2014-08-10 LAB — BASIC METABOLIC PANEL
ANION GAP: 9 (ref 5–15)
BUN: 17 mg/dL (ref 6–20)
CALCIUM: 9.2 mg/dL (ref 8.9–10.3)
CO2: 30 mmol/L (ref 22–32)
Chloride: 101 mmol/L (ref 101–111)
Creatinine, Ser: 0.75 mg/dL (ref 0.44–1.00)
GFR calc Af Amer: >60 mL/min (ref >60)
GFR calc non Af Amer: >60 mL/min (ref >60)
GLUCOSE: 79 mg/dL (ref 65–99)
POTASSIUM: 3.4 mmol/L — AB (ref 3.5–5.1)
Sodium: 140 mmol/L (ref 135–145)

## 2014-08-10 LAB — URINALYSIS, ROUTINE W REFLEX MICROSCOPIC
BILIRUBIN URINE: NEGATIVE
Glucose, UA: NEGATIVE mg/dL
HGB URINE DIPSTICK: NEGATIVE
KETONES UR: NEGATIVE mg/dL
LEUKOCYTES UA: NEGATIVE
Nitrite: NEGATIVE
Protein, ur: NEGATIVE mg/dL
Specific Gravity, Urine: 1.014 (ref 1.005–1.030)
UROBILINOGEN UA: 0.2 mg/dL (ref 0.0–1.0)
pH: 5 (ref 5.0–8.0)

## 2014-08-10 LAB — ABO/RH: ABO/RH(D): O POS

## 2014-08-10 LAB — PROTIME-INR
INR: 0.92 (ref 0.00–1.49)
Prothrombin Time: 12.6 seconds (ref 11.6–15.2)

## 2014-08-10 LAB — APTT: aPTT: 35 seconds (ref 24–37)

## 2014-08-10 NOTE — Progress Notes (Signed)
05/26/2014-Pre-operative clearance from Dr. Hyman Hopes on chart.

## 2014-08-10 NOTE — Progress Notes (Signed)
   08/10/14 1346  OBSTRUCTIVE SLEEP APNEA  Have you ever been diagnosed with sleep apnea through a sleep study? No (had a study and not sure had sleep apnea)  Do you snore loudly (loud enough to be heard through closed doors)?  1  Do you often feel tired, fatigued, or sleepy during the daytime? 0  Has anyone observed you stop breathing during your sleep? 0  Do you have, or are you being treated for high blood pressure? 1  BMI more than 35 kg/m2? 1  Age over 58 years old? 1  Neck circumference greater than 40 cm/16 inches? 0  Gender: 0

## 2014-08-14 NOTE — H&P (Signed)
TOTAL HIP ADMISSION H&P  Patient is admitted for left total hip arthroplasty, anterior approach.  Subjective:  Chief Complaint:    Left hip primary OA / pain  HPI: Margaret Hancock, 59 y.o. female, has a history of pain and functional disability in the left hip(s) due to arthritis and patient has failed non-surgical conservative treatments for greater than 12 weeks to include NSAID's and/or analgesics, corticosteriod injections and activity modification.  Onset of symptoms was gradual starting 2+ years ago with gradually worsening course since that time.The patient noted no past surgery on the left hip(s).  Patient currently rates pain in the left hip at 8 out of 10 with activity. Patient has worsening of pain with activity and weight bearing, trendelenberg gait, pain that interfers with activities of daily living and pain with passive range of motion. Patient has evidence of periarticular osteophytes and joint space narrowing by imaging studies. This condition presents safety issues increasing the risk of falls.  There is no current active infection.  Risks, benefits and expectations were discussed with the patient.  Risks including but not limited to the risk of anesthesia, blood clots, nerve damage, blood vessel damage, failure of the prosthesis, infection and up to and including death.  Patient understand the risks, benefits and expectations and wishes to proceed with surgery.   PCP: Frederich Chick, MD  D/C Plans:     Home with HHPT  Post-op Meds:       No Rx given  Tranexamic Acid:      To be given - IV   Decadron:      Is to be given  FYI:     ASA post-op  Norco post-op    Patient Active Problem List   Diagnosis Date Noted  . Hypertension 11/17/2012   Past Medical History  Diagnosis Date  . Hyperlipidemia   . Thyroid disease   . Hypertension   . Obesity   . Osteoarthritis   . OA (osteoarthritis)     Past Surgical History  Procedure Laterality Date  . Lumbar fusion      x 2   . Foot surgery    . Cesarean section    . Eye surgery      Lasik -bilateral    No prescriptions prior to admission   Allergies  Allergen Reactions  . Anesthetics, Ester Nausea Only    Laughing gas    History  Substance Use Topics  . Smoking status: Never Smoker   . Smokeless tobacco: Not on file  . Alcohol Use: Yes     Comment: occassionally-wine    Family History  Problem Relation Age of Onset  . Stroke Father 78  . Hypertension Father   . Stroke Mother   . CAD Brother 45  . Diabetes Sister 69     Review of Systems  Constitutional: Negative.   HENT: Negative.   Eyes: Negative.   Respiratory: Negative.   Cardiovascular: Negative.   Gastrointestinal: Negative.   Genitourinary: Negative.   Musculoskeletal: Positive for back pain and joint pain.  Skin: Negative.   Neurological: Negative.   Endo/Heme/Allergies: Negative.   Psychiatric/Behavioral: Negative.     Objective:  Physical Exam  Constitutional: She is oriented to person, place, and time. She appears well-developed and well-nourished.  HENT:  Head: Normocephalic.  Eyes: Pupils are equal, round, and reactive to light.  Neck: Neck supple. No JVD present. No tracheal deviation present. No thyromegaly present.  Cardiovascular: Normal rate, regular rhythm, normal heart sounds  and intact distal pulses.   Respiratory: Effort normal and breath sounds normal. No stridor. No respiratory distress. She has no wheezes.  GI: Soft. There is no tenderness. There is no guarding.  Musculoskeletal:       Left hip: She exhibits decreased range of motion, decreased strength, tenderness and bony tenderness. She exhibits no swelling, no deformity and no laceration.  Lymphadenopathy:    She has no cervical adenopathy.  Neurological: She is alert and oriented to person, place, and time.  Skin: Skin is warm and dry.  Psychiatric: She has a normal mood and affect.     Labs:   Estimated body mass index is 39.14 kg/(m^2) as  calculated from the following:   Height as of 12/31/12: 5' 2.5" (1.588 m).   Weight as of 12/31/12: 98.703 kg (217 lb 9.6 oz).   Imaging Review Plain radiographs demonstrate severe degenerative joint disease of the left hip(s). The bone quality appears to be good for age and reported activity level.  Assessment/Plan:  End stage arthritis, left hip(s)  The patient history, physical examination, clinical judgement of the provider and imaging studies are consistent with end stage degenerative joint disease of the left hip(s) and total hip arthroplasty is deemed medically necessary. The treatment options including medical management, injection therapy, arthroscopy and arthroplasty were discussed at length. The risks and benefits of total hip arthroplasty were presented and reviewed. The risks due to aseptic loosening, infection, stiffness, dislocation/subluxation,  thromboembolic complications and other imponderables were discussed.  The patient acknowledged the explanation, agreed to proceed with the plan and consent was signed. Patient is being admitted for inpatient treatment for surgery, pain control, PT, OT, prophylactic antibiotics, VTE prophylaxis, progressive ambulation and ADL's and discharge planning.The patient is planning to be discharged home with home health services.    Anastasio Auerbach Aaleyah Witherow   PA-C  08/14/2014, 10:35 PM

## 2014-08-16 ENCOUNTER — Inpatient Hospital Stay (HOSPITAL_COMMUNITY): Payer: BC Managed Care – PPO | Admitting: Anesthesiology

## 2014-08-16 ENCOUNTER — Encounter (HOSPITAL_COMMUNITY)
Admission: AD | Disposition: A | Discharge status: Home Health | Payer: Self-pay | Source: Ambulatory Visit | Attending: Orthopedic Surgery

## 2014-08-16 ENCOUNTER — Inpatient Hospital Stay (HOSPITAL_COMMUNITY): Payer: BC Managed Care – PPO

## 2014-08-16 ENCOUNTER — Encounter (HOSPITAL_COMMUNITY): Payer: Self-pay | Admitting: *Deleted

## 2014-08-16 ENCOUNTER — Inpatient Hospital Stay (HOSPITAL_COMMUNITY)
Admission: AD | Admit: 2014-08-16 | Discharge: 2014-08-17 | DRG: 470 | Disposition: A | Discharge status: Home Health | Payer: BC Managed Care – PPO | Source: Ambulatory Visit | Attending: Orthopedic Surgery | Admitting: Orthopedic Surgery

## 2014-08-16 DIAGNOSIS — M1632 Unilateral osteoarthritis resulting from hip dysplasia, left hip: Secondary | ICD-10-CM | POA: Diagnosis present

## 2014-08-16 DIAGNOSIS — M25552 Pain in left hip: Secondary | ICD-10-CM | POA: Diagnosis present

## 2014-08-16 DIAGNOSIS — Z6837 Body mass index (BMI) 37.0-37.9, adult: Secondary | ICD-10-CM | POA: Diagnosis not present

## 2014-08-16 DIAGNOSIS — E785 Hyperlipidemia, unspecified: Secondary | ICD-10-CM | POA: Diagnosis present

## 2014-08-16 DIAGNOSIS — Z01812 Encounter for preprocedural laboratory examination: Secondary | ICD-10-CM

## 2014-08-16 DIAGNOSIS — E669 Obesity, unspecified: Secondary | ICD-10-CM | POA: Diagnosis present

## 2014-08-16 DIAGNOSIS — Z96649 Presence of unspecified artificial hip joint: Secondary | ICD-10-CM

## 2014-08-16 DIAGNOSIS — I10 Essential (primary) hypertension: Secondary | ICD-10-CM | POA: Diagnosis present

## 2014-08-16 HISTORY — PX: TOTAL HIP ARTHROPLASTY: SHX124

## 2014-08-16 LAB — TYPE AND SCREEN
ABO/RH(D): O POS
Antibody Screen: NEGATIVE

## 2014-08-16 SURGERY — ARTHROPLASTY, HIP, TOTAL, ANTERIOR APPROACH
Anesthesia: General | Site: Hip | Laterality: Left

## 2014-08-16 MED ORDER — MIDAZOLAM HCL 2 MG/2ML IJ SOLN
INTRAMUSCULAR | Status: AC
  Administered 2014-08-16: 1 mg
  Filled 2014-08-16: qty 2

## 2014-08-16 MED ORDER — EPHEDRINE SULFATE 50 MG/ML IJ SOLN
INTRAMUSCULAR | Status: AC
  Filled 2014-08-16: qty 1

## 2014-08-16 MED ORDER — DIPHENHYDRAMINE HCL 25 MG PO CAPS: 25.0000 mg | ORAL_CAPSULE | Freq: Four times a day (QID) | ORAL | Status: DC | PRN

## 2014-08-16 MED ORDER — SUCCINYLCHOLINE CHLORIDE 20 MG/ML IJ SOLN
INTRAMUSCULAR | Status: DC | PRN
  Administered 2014-08-16: 100 mg via INTRAVENOUS

## 2014-08-16 MED ORDER — FENTANYL CITRATE (PF) 250 MCG/5ML IJ SOLN
INTRAMUSCULAR | Status: DC | PRN
Start: 2014-08-16 — End: 2014-08-16
  Administered 2014-08-16: 50 ug via INTRAVENOUS

## 2014-08-16 MED ORDER — LACTATED RINGERS IV SOLN
INTRAVENOUS | Status: DC
  Administered 2014-08-16: 1000 mL via INTRAVENOUS

## 2014-08-16 MED ORDER — ASPIRIN EC 325 MG PO TBEC
325.0000 mg | DELAYED_RELEASE_TABLET | Freq: Two times a day (BID) | ORAL | Status: DC
  Administered 2014-08-17: 325 mg via ORAL
  Filled 2014-08-16 (×3): qty 1

## 2014-08-16 MED ORDER — HYDROMORPHONE HCL 1 MG/ML IJ SOLN
INTRAMUSCULAR | Status: DC | PRN
  Administered 2014-08-16 (×5): .4 mg via INTRAVENOUS

## 2014-08-16 MED ORDER — SUFENTANIL CITRATE 50 MCG/ML IV SOLN
INTRAVENOUS | Status: AC
  Filled 2014-08-16: qty 1

## 2014-08-16 MED ORDER — HYDROCHLOROTHIAZIDE 25 MG PO TABS
25.0000 mg | ORAL_TABLET | Freq: Every morning | ORAL | Status: DC
  Administered 2014-08-16 – 2014-08-17 (×2): 25 mg via ORAL
  Filled 2014-08-16 (×2): qty 1

## 2014-08-16 MED ORDER — MIDAZOLAM HCL 2 MG/2ML IJ SOLN
INTRAMUSCULAR | Status: AC
  Filled 2014-08-16: qty 4

## 2014-08-16 MED ORDER — PROPOFOL 10 MG/ML IV BOLUS
INTRAVENOUS | Status: AC
  Filled 2014-08-16: qty 20

## 2014-08-16 MED ORDER — METOCLOPRAMIDE HCL 10 MG PO TABS: 5.0000 mg | ORAL_TABLET | Freq: Three times a day (TID) | ORAL | Status: DC | PRN

## 2014-08-16 MED ORDER — HYDROMORPHONE HCL 1 MG/ML IJ SOLN
0.5000 mg | INTRAMUSCULAR | Status: DC | PRN
  Administered 2014-08-16: 1 mg via INTRAVENOUS
  Filled 2014-08-16: qty 1

## 2014-08-16 MED ORDER — OMEPRAZOLE 20 MG PO CPDR
20.0000 mg | DELAYED_RELEASE_CAPSULE | Freq: Every day | ORAL | Status: DC
  Administered 2014-08-16: 20 mg via ORAL
  Filled 2014-08-16 (×2): qty 1

## 2014-08-16 MED ORDER — METHOCARBAMOL 500 MG PO TABS: 500.0000 mg | ORAL_TABLET | Freq: Four times a day (QID) | ORAL | Status: DC | PRN

## 2014-08-16 MED ORDER — DEXAMETHASONE SODIUM PHOSPHATE 10 MG/ML IJ SOLN
10.0000 mg | Freq: Once | INTRAMUSCULAR | Status: AC
  Administered 2014-08-17: 10 mg via INTRAVENOUS
  Filled 2014-08-16: qty 1

## 2014-08-16 MED ORDER — ONDANSETRON HCL 4 MG PO TABS: 4.0000 mg | ORAL_TABLET | Freq: Four times a day (QID) | ORAL | Status: DC | PRN

## 2014-08-16 MED ORDER — DEXAMETHASONE SODIUM PHOSPHATE 10 MG/ML IJ SOLN
10.0000 mg | Freq: Once | INTRAMUSCULAR | Status: AC
  Administered 2014-08-16: 10 mg via INTRAVENOUS

## 2014-08-16 MED ORDER — SODIUM CHLORIDE 0.9 % IJ SOLN
INTRAMUSCULAR | Status: AC
  Filled 2014-08-16: qty 20

## 2014-08-16 MED ORDER — SODIUM CHLORIDE 0.9 % IV SOLN
1000.0000 mg | Freq: Once | INTRAVENOUS | Status: AC
  Administered 2014-08-16: 1000 mg via INTRAVENOUS
  Filled 2014-08-16: qty 10

## 2014-08-16 MED ORDER — SODIUM CHLORIDE 0.9 % IV SOLN
100.0000 mL/h | INTRAVENOUS | Status: DC
  Administered 2014-08-16 – 2014-08-17 (×2): 100 mL/h via INTRAVENOUS
  Filled 2014-08-16 (×10): qty 1000

## 2014-08-16 MED ORDER — SUFENTANIL CITRATE 50 MCG/ML IV SOLN
INTRAVENOUS | Status: DC | PRN
  Administered 2014-08-16 (×3): 10 ug via INTRAVENOUS
  Administered 2014-08-16: 20 ug via INTRAVENOUS

## 2014-08-16 MED ORDER — PHENOL 1.4 % MT LIQD
1.0000 | OROMUCOSAL | Status: DC | PRN
  Filled 2014-08-16: qty 177

## 2014-08-16 MED ORDER — MENTHOL 3 MG MT LOZG
1.0000 | LOZENGE | OROMUCOSAL | Status: DC | PRN
Start: 2014-08-16 — End: 2014-08-17

## 2014-08-16 MED ORDER — FERROUS SULFATE 325 (65 FE) MG PO TABS
325.0000 mg | ORAL_TABLET | Freq: Three times a day (TID) | ORAL | Status: DC
  Administered 2014-08-16 – 2014-08-17 (×3): 325 mg via ORAL
  Filled 2014-08-16 (×5): qty 1

## 2014-08-16 MED ORDER — CHLORHEXIDINE GLUCONATE 4 % EX LIQD: 60.0000 mL | Freq: Once | CUTANEOUS | Status: DC

## 2014-08-16 MED ORDER — CISATRACURIUM BESYLATE (PF) 10 MG/5ML IV SOLN
INTRAVENOUS | Status: DC | PRN
  Administered 2014-08-16: 2 mg via INTRAVENOUS
  Administered 2014-08-16: 10 mg via INTRAVENOUS

## 2014-08-16 MED ORDER — DOCUSATE SODIUM 100 MG PO CAPS
100.0000 mg | ORAL_CAPSULE | Freq: Two times a day (BID) | ORAL | Status: DC
  Administered 2014-08-16 – 2014-08-17 (×2): 100 mg via ORAL

## 2014-08-16 MED ORDER — GLYCOPYRROLATE 0.2 MG/ML IJ SOLN
INTRAMUSCULAR | Status: AC
  Filled 2014-08-16: qty 3

## 2014-08-16 MED ORDER — ONDANSETRON HCL 4 MG/2ML IJ SOLN
4.0000 mg | Freq: Four times a day (QID) | INTRAMUSCULAR | Status: DC | PRN
  Administered 2014-08-16: 4 mg via INTRAVENOUS
  Filled 2014-08-16: qty 2

## 2014-08-16 MED ORDER — NEOSTIGMINE METHYLSULFATE 10 MG/10ML IV SOLN
INTRAVENOUS | Status: DC | PRN
  Administered 2014-08-16: 4 mg via INTRAVENOUS

## 2014-08-16 MED ORDER — MIDAZOLAM HCL 5 MG/5ML IJ SOLN
INTRAMUSCULAR | Status: DC | PRN
Start: 2014-08-16 — End: 2014-08-16
  Administered 2014-08-16: 2 mg via INTRAVENOUS

## 2014-08-16 MED ORDER — HYDROCODONE-ACETAMINOPHEN 7.5-325 MG PO TABS
1.0000 | ORAL_TABLET | ORAL | Status: DC
  Administered 2014-08-16 – 2014-08-17 (×5): 2 via ORAL
  Filled 2014-08-16 (×7): qty 2

## 2014-08-16 MED ORDER — LIDOCAINE HCL (CARDIAC) 20 MG/ML IV SOLN
INTRAVENOUS | Status: AC
  Filled 2014-08-16: qty 5

## 2014-08-16 MED ORDER — LEVOTHYROXINE SODIUM 125 MCG PO TABS
125.0000 ug | ORAL_TABLET | Freq: Every day | ORAL | Status: DC
  Administered 2014-08-17: 125 ug via ORAL
  Filled 2014-08-16 (×2): qty 1

## 2014-08-16 MED ORDER — HYDROMORPHONE HCL 1 MG/ML IJ SOLN
0.2500 mg | INTRAMUSCULAR | Status: DC | PRN
  Administered 2014-08-16 (×3): 0.5 mg via INTRAVENOUS

## 2014-08-16 MED ORDER — ONDANSETRON HCL 4 MG/2ML IJ SOLN
INTRAMUSCULAR | Status: DC | PRN
  Administered 2014-08-16: 4 mg via INTRAVENOUS

## 2014-08-16 MED ORDER — MUPIROCIN 2 % EX OINT
1.0000 "application " | TOPICAL_OINTMENT | Freq: Once | CUTANEOUS | Status: AC
  Administered 2014-08-16: 1 via TOPICAL
  Filled 2014-08-16: qty 22

## 2014-08-16 MED ORDER — HYDROMORPHONE HCL 1 MG/ML IJ SOLN
INTRAMUSCULAR | Status: AC
  Filled 2014-08-16: qty 1

## 2014-08-16 MED ORDER — PROMETHAZINE HCL 25 MG/ML IJ SOLN: 6.2500 mg | INTRAMUSCULAR | Status: DC | PRN

## 2014-08-16 MED ORDER — ATORVASTATIN CALCIUM 40 MG PO TABS
40.0000 mg | ORAL_TABLET | Freq: Every day | ORAL | Status: DC
  Administered 2014-08-16: 40 mg via ORAL
  Filled 2014-08-16 (×2): qty 1

## 2014-08-16 MED ORDER — LACTATED RINGERS IV SOLN
INTRAVENOUS | Status: DC | PRN
  Administered 2014-08-16 (×3): via INTRAVENOUS

## 2014-08-16 MED ORDER — ONDANSETRON HCL 4 MG/2ML IJ SOLN
INTRAMUSCULAR | Status: AC
  Filled 2014-08-16: qty 2

## 2014-08-16 MED ORDER — CELECOXIB 200 MG PO CAPS
200.0000 mg | ORAL_CAPSULE | Freq: Two times a day (BID) | ORAL | Status: DC
  Administered 2014-08-16 – 2014-08-17 (×2): 200 mg via ORAL
  Filled 2014-08-16 (×3): qty 1

## 2014-08-16 MED ORDER — SODIUM CHLORIDE 0.9 % IJ SOLN
INTRAMUSCULAR | Status: AC
  Filled 2014-08-16: qty 10

## 2014-08-16 MED ORDER — AMLODIPINE BESYLATE 10 MG PO TABS
10.0000 mg | ORAL_TABLET | Freq: Every day | ORAL | Status: DC
  Administered 2014-08-16: 10 mg via ORAL
  Filled 2014-08-16 (×2): qty 1

## 2014-08-16 MED ORDER — METHOCARBAMOL 1000 MG/10ML IJ SOLN
500.0000 mg | Freq: Four times a day (QID) | INTRAVENOUS | Status: DC | PRN
  Administered 2014-08-16 (×2): 500 mg via INTRAVENOUS
  Filled 2014-08-16 (×3): qty 5

## 2014-08-16 MED ORDER — MAGNESIUM CITRATE PO SOLN: 1.0000 | Freq: Once | ORAL | Status: AC | PRN

## 2014-08-16 MED ORDER — SODIUM CHLORIDE 0.9 % IR SOLN
Status: DC | PRN
Start: 2014-08-16 — End: 2014-08-16
  Administered 2014-08-16: 1000 mL

## 2014-08-16 MED ORDER — PROPOFOL 10 MG/ML IV BOLUS
INTRAVENOUS | Status: DC | PRN
  Administered 2014-08-16: 150 mg via INTRAVENOUS

## 2014-08-16 MED ORDER — CEFAZOLIN SODIUM-DEXTROSE 2-3 GM-% IV SOLR
2.0000 g | Freq: Four times a day (QID) | INTRAVENOUS | Status: AC
  Administered 2014-08-16 – 2014-08-17 (×2): 2 g via INTRAVENOUS
  Filled 2014-08-16 (×2): qty 50

## 2014-08-16 MED ORDER — FENTANYL CITRATE (PF) 100 MCG/2ML IJ SOLN
INTRAMUSCULAR | Status: AC
  Filled 2014-08-16: qty 2

## 2014-08-16 MED ORDER — LIDOCAINE HCL (CARDIAC) 20 MG/ML IV SOLN
INTRAVENOUS | Status: DC | PRN
  Administered 2014-08-16: 80 mg via INTRAVENOUS

## 2014-08-16 MED ORDER — METOCLOPRAMIDE HCL 5 MG/ML IJ SOLN
5.0000 mg | Freq: Three times a day (TID) | INTRAMUSCULAR | Status: DC | PRN
  Administered 2014-08-16: 5 mg via INTRAVENOUS
  Filled 2014-08-16: qty 2

## 2014-08-16 MED ORDER — DEXAMETHASONE SODIUM PHOSPHATE 10 MG/ML IJ SOLN
INTRAMUSCULAR | Status: AC
  Filled 2014-08-16: qty 1

## 2014-08-16 MED ORDER — ALUM & MAG HYDROXIDE-SIMETH 200-200-20 MG/5ML PO SUSP: 30.0000 mL | ORAL | Status: DC | PRN

## 2014-08-16 MED ORDER — BISACODYL 10 MG RE SUPP: 10.0000 mg | Freq: Every day | RECTAL | Status: DC | PRN

## 2014-08-16 MED ORDER — CISATRACURIUM BESYLATE 20 MG/10ML IV SOLN
INTRAVENOUS | Status: AC
  Filled 2014-08-16: qty 10

## 2014-08-16 MED ORDER — GLYCOPYRROLATE 0.2 MG/ML IJ SOLN
INTRAMUSCULAR | Status: DC | PRN
  Administered 2014-08-16: .6 mg via INTRAVENOUS

## 2014-08-16 MED ORDER — CEFAZOLIN SODIUM-DEXTROSE 2-3 GM-% IV SOLR
INTRAVENOUS | Status: AC
  Filled 2014-08-16: qty 50

## 2014-08-16 MED ORDER — POLYETHYLENE GLYCOL 3350 17 G PO PACK
17.0000 g | PACK | Freq: Two times a day (BID) | ORAL | Status: DC
  Administered 2014-08-16 – 2014-08-17 (×2): 17 g via ORAL

## 2014-08-16 MED ORDER — CEFAZOLIN SODIUM-DEXTROSE 2-3 GM-% IV SOLR
2.0000 g | INTRAVENOUS | Status: AC
  Administered 2014-08-16: 2 g via INTRAVENOUS

## 2014-08-16 MED ORDER — HYDROMORPHONE HCL 2 MG/ML IJ SOLN
INTRAMUSCULAR | Status: AC
  Filled 2014-08-16: qty 1

## 2014-08-16 SURGICAL SUPPLY — 44 items
BAG DECANTER FOR FLEXI CONT (MISCELLANEOUS) IMPLANT
BAG SPEC THK2 15X12 ZIP CLS (MISCELLANEOUS)
BAG ZIPLOCK 12X15 (MISCELLANEOUS) IMPLANT
CAPT HIP TOTAL 2 ×2 IMPLANT
COVER PERINEAL POST (MISCELLANEOUS) ×2 IMPLANT
DRAPE C-ARM 42X120 X-RAY (DRAPES) ×2 IMPLANT
DRAPE STERI IOBAN 125X83 (DRAPES) ×2 IMPLANT
DRAPE U-SHAPE 47X51 STRL (DRAPES) ×6 IMPLANT
DRSG AQUACEL AG ADV 3.5X10 (GAUZE/BANDAGES/DRESSINGS) ×2 IMPLANT
DRSG OPSITE POSTOP 4X6 (GAUZE/BANDAGES/DRESSINGS) ×2 IMPLANT
DURAPREP 26ML APPLICATOR (WOUND CARE) ×2 IMPLANT
ELECT BLADE TIP CTD 4 INCH (ELECTRODE) ×2 IMPLANT
ELECT PENCIL ROCKER SW 15FT (MISCELLANEOUS) IMPLANT
ELECT REM PT RETURN 15FT ADLT (MISCELLANEOUS) IMPLANT
ELECT REM PT RETURN 9FT ADLT (ELECTROSURGICAL) ×2
ELECTRODE REM PT RTRN 9FT ADLT (ELECTROSURGICAL) ×1 IMPLANT
FACESHIELD WRAPAROUND (MASK) ×8 IMPLANT
GLOVE BIOGEL PI IND STRL 7.5 (GLOVE) ×1 IMPLANT
GLOVE BIOGEL PI IND STRL 8.5 (GLOVE) ×1 IMPLANT
GLOVE BIOGEL PI INDICATOR 7.5 (GLOVE) ×1
GLOVE BIOGEL PI INDICATOR 8.5 (GLOVE) ×1
GLOVE ECLIPSE 8.0 STRL XLNG CF (GLOVE) ×4 IMPLANT
GLOVE ORTHO TXT STRL SZ7.5 (GLOVE) ×2 IMPLANT
GOWN SPEC L3 XXLG W/TWL (GOWN DISPOSABLE) ×2 IMPLANT
GOWN STRL REUS W/TWL LRG LVL3 (GOWN DISPOSABLE) ×2 IMPLANT
HOLDER FOLEY CATH W/STRAP (MISCELLANEOUS) ×2 IMPLANT
KIT BASIN OR (CUSTOM PROCEDURE TRAY) ×2 IMPLANT
LIQUID BAND (GAUZE/BANDAGES/DRESSINGS) ×2 IMPLANT
NDL SAFETY ECLIPSE 18X1.5 (NEEDLE) IMPLANT
NEEDLE HYPO 18GX1.5 SHARP (NEEDLE)
PACK TOTAL JOINT (CUSTOM PROCEDURE TRAY) ×2 IMPLANT
PEN SKIN MARKING BROAD (MISCELLANEOUS) ×2 IMPLANT
SAW OSC TIP CART 19.5X105X1.3 (SAW) ×2 IMPLANT
SUT MNCRL AB 4-0 PS2 18 (SUTURE) ×2 IMPLANT
SUT VIC AB 1 CT1 36 (SUTURE) ×6 IMPLANT
SUT VIC AB 2-0 CT1 27 (SUTURE) ×4
SUT VIC AB 2-0 CT1 TAPERPNT 27 (SUTURE) ×2 IMPLANT
SUT VLOC 180 0 24IN GS25 (SUTURE) ×2 IMPLANT
SYR 50ML LL SCALE MARK (SYRINGE) IMPLANT
TOWEL OR 17X26 10 PK STRL BLUE (TOWEL DISPOSABLE) ×2 IMPLANT
TOWEL OR NON WOVEN STRL DISP B (DISPOSABLE) IMPLANT
TRAY FOLEY W/METER SILVER 14FR (SET/KITS/TRAYS/PACK) ×2 IMPLANT
WATER STERILE IRR 1500ML POUR (IV SOLUTION) ×2 IMPLANT
YANKAUER SUCT BULB TIP 10FT TU (MISCELLANEOUS) ×2 IMPLANT

## 2014-08-16 NOTE — Progress Notes (Signed)
Pt tearful upon arrival to pacu, tenses her extemeties. CRNA reports pt was very anxious before surgery.

## 2014-08-16 NOTE — Anesthesia Preprocedure Evaluation (Addendum)
Anesthesia Evaluation  Patient identified by MRN, date of birth, ID band Patient awake    Reviewed: Allergy & Precautions, NPO status , Patient's Chart, lab work & pertinent test results  Airway Mallampati: II  TM Distance: >3 FB Neck ROM: Full    Dental no notable dental hx.    Pulmonary neg pulmonary ROS,  breath sounds clear to auscultation  Pulmonary exam normal       Cardiovascular Exercise Tolerance: Good hypertension, Pt. on medications Normal cardiovascular examRhythm:Regular Rate:Normal     Neuro/Psych negative neurological ROS  negative psych ROS   GI/Hepatic negative GI ROS, Neg liver ROS,   Endo/Other  negative endocrine ROS  Renal/GU negative Renal ROS  negative genitourinary   Musculoskeletal  (+) Arthritis -,   Abdominal   Peds negative pediatric ROS (+)  Hematology negative hematology ROS (+)   Anesthesia Other Findings   Reproductive/Obstetrics negative OB ROS                          Anesthesia Physical Anesthesia Plan  ASA: II  Anesthesia Plan: General   Post-op Pain Management:    Induction: Intravenous  Airway Management Planned: Oral ETT  Additional Equipment:   Intra-op Plan:   Post-operative Plan: Extubation in OR  Informed Consent: I have reviewed the patients History and Physical, chart, labs and discussed the procedure including the risks, benefits and alternatives for the proposed anesthesia with the patient or authorized representative who has indicated his/her understanding and acceptance.   Dental advisory given  Plan Discussed with: CRNA  Anesthesia Plan Comments: (Discussed risks and benefits of spinal and general. She has had a posterior spinal fusion between L3-5. Offered to attempt spinal at L2-3, but she consents to general.)     Anesthesia Quick Evaluation

## 2014-08-16 NOTE — Transfer of Care (Signed)
Immediate Anesthesia Transfer of Care Note  Patient: Margaret Hancock  Procedure(s) Performed: Procedure(s): LEFT TOTAL HIP ARTHROPLASTY ANTERIOR APPROACH (Left)  Patient Location: PACU  Anesthesia Type:General  Level of Consciousness: awake, alert  and oriented  Airway & Oxygen Therapy: Patient Spontanous Breathing and Patient connected to face mask oxygen  Post-op Assessment: Report given to RN and Post -op Vital signs reviewed and stable  Post vital signs: Reviewed and stable  Last Vitals:  Filed Vitals:   08/16/14 0835  BP: 144/82  Pulse: 85  Temp: 36.4 C  Resp: 16    Complications: No apparent anesthesia complications

## 2014-08-16 NOTE — Anesthesia Postprocedure Evaluation (Signed)
  Anesthesia Post-op Note  Patient: Margaret Hancock  Procedure(s) Performed: Procedure(s) (LRB): LEFT TOTAL HIP ARTHROPLASTY ANTERIOR APPROACH (Left)  Patient Location: PACU  Anesthesia Type: General  Level of Consciousness: awake and alert   Airway and Oxygen Therapy: Patient Spontanous Breathing  Post-op Pain: mild  Post-op Assessment: Post-op Vital signs reviewed, Patient's Cardiovascular Status Stable, Respiratory Function Stable, Patent Airway and No signs of Nausea or vomiting  Last Vitals:  Filed Vitals:   08/16/14 1454  BP: 123/51  Pulse: 77  Temp: 36.7 C  Resp: 14    Post-op Vital Signs: stable   Complications: No apparent anesthesia complications

## 2014-08-16 NOTE — Interval H&P Note (Signed)
History and Physical Interval Note:  08/16/2014 10:28 AM  Margaret Hancock  has presented today for surgery, with the diagnosis of LEFT HIP OA   The various methods of treatment have been discussed with the patient and family. After consideration of risks, benefits and other options for treatment, the patient has consented to  Procedure(s): LEFT TOTAL HIP ARTHROPLASTY ANTERIOR APPROACH (Left) as a surgical intervention .  The patient's history has been reviewed, patient examined, no change in status, stable for surgery.  I have reviewed the patient's chart and labs.  Questions were answered to the patient's satisfaction.     [name]

## 2014-08-16 NOTE — Op Note (Signed)
NAME:  Margaret Hancock                ACCOUNT NO.: [id number]      MEDICAL RECORD NO.: [id number]      FACILITY:  [facility]      PHYSICIAN:  Durene Romans D  DATE OF BIRTH:  1955/08/20     DATE OF PROCEDURE:  08/16/2014                                 OPERATIVE REPORT         PREOPERATIVE DIAGNOSIS: Left  hip osteoarthritis secondary to hip dysplasia      POSTOPERATIVE DIAGNOSIS:  Left hip osteoarthritis secondary to hip dysplasia     PROCEDURE:  Left total hip replacement through an anterior approach   utilizing DePuy THR system, component size 52mm pinnacle cup, a size 36+4 neutral   Altrex liner, a size 1 Hi Tri Lock stem with a 36+1.5 delta ceramic   ball.      SURGEON:  Madlyn Frankel. Charlann Boxer, M.D.      ASSISTANT:  Skip Mayer, PA-C      ANESTHESIA:  General.      SPECIMENS:  None.      COMPLICATIONS:  None.      BLOOD LOSS:  500 cc     DRAINS:  None.      INDICATION OF THE PROCEDURE:  Margaret Hancock is a 59 y.o. female who had   presented to office for evaluation of left hip pain.  Radiographs revealed   progressive degenerative changes with bone-on-bone   articulation to the  hip joint with radiographic findings consistent with congenital hip dysplasia.  The patient had painful limited range of   motion significantly affecting their overall quality of life.  The patient was failing to    respond to conservative measures, and at this point was ready   to proceed with more definitive measures.  The patient has noted progressive   degenerative changes in his hip, progressive problems and dysfunction   with regarding the hip prior to surgery.  Consent was obtained for   benefit of pain relief.  Specific risk of infection, DVT, component   failure, dislocation, need for revision surgery, as well discussion of   the anterior versus posterior approach were reviewed.  Consent was   obtained for benefit of anterior pain relief through an anterior   approach.      PROCEDURE IN DETAIL:  The patient was brought to operative theater.   Once adequate anesthesia, preoperative antibiotics, 2gm of Ancef, 1gm of Tranexamic Acid, and 10mg  of Decadron administered.   The patient was positioned supine on the OSI Hanna table.  Once adequate   padding of boney process was carried out, we had predraped out the hip, and  used fluoroscopy to confirm orientation of the pelvis and position.      The left hip was then prepped and draped from proximal iliac crest to   mid thigh with shower curtain technique.      Time-out was performed identifying the patient, planned procedure, and   extremity.     An incision was then made 2 cm distal and lateral to the   anterior superior iliac spine extending over the orientation of the   tensor fascia lata muscle and sharp dissection was carried down to the   fascia of the muscle and protractor  placed in the soft tissues.      The fascia was then incised.  The muscle belly was identified and swept   laterally and retractor placed along the superior neck.  Following   cauterization of the circumflex vessels and removing some pericapsular   fat, a second cobra retractor was placed on the inferior neck.  A third   retractor was placed on the anterior acetabulum after elevating the   anterior rectus.  A L-capsulotomy was along the line of the   superior neck to the trochanteric fossa, then extended proximally and   distally.  Tag sutures were placed and the retractors were then placed   intracapsular.  We then identified the trochanteric fossa and   orientation of my neck cut, confirmed this radiographically   and then made a neck osteotomy with the femur on traction.  The femoral   head was removed without difficulty or complication.  Traction was let   off and retractors were placed posterior and anterior around the   acetabulum.      The labrum and foveal tissue were debrided.  I began reaming with a 45mm    reamer and reamed up to 51mm reamer with good bony bed preparation and a 52mm   cup was chosen.  The final 52mm Pinnacle cup was then impacted under fluoroscopy  to confirm the depth of penetration and orientation with respect to   abduction.  A screw was placed followed by the hole eliminator.  The final   36+4 neutral Altrex liner was impacted with good visualized rim fit.  The cup was positioned anatomically within the acetabular portion of the pelvis.      At this point, the femur was rolled at 80 degrees.  Further capsule was   released off the inferior aspect of the femoral neck.  I then   released the superior capsule proximally.  The hook was placed laterally   along the femur and elevated manually and held in position with the bed   hook.  The leg was then extended and adducted with the leg rolled to 100   degrees of external rotation.  Once the proximal femur was fully   exposed, I used a box osteotome to set orientation.  I then began   broaching with the starting chili pepper broach and passed this by hand and then broached up to 1.  With the 1 broach in place I chose a high offset neck and did several trial reductions.  The offset was appropriate, leg lengths   appeared to be equal best with this configuration and the +1.5 head ball, confirmed radiographically.   Given these findings, I went ahead and dislocated the hip, repositioned all   retractors and positioned the right hip in the extended and abducted position.  The final 1 Hi Tri Lock stem was   chosen and it was impacted down to the level of neck cut.  Based on this   and the trial reduction, a 36+1.5 delta ceramic ball was chosen and   impacted onto a clean and dry trunnion, and the hip was reduced.  The   hip had been irrigated throughout the case again at this point.  I did   reapproximate the superior capsular leaflet to the anterior leaflet   using #1 Vicryl.  The fascia of the   tensor fascia lata muscle was then  reapproximated using #1 Vicryl and #0 V-lock sutures.  The   remaining wound was closed  with 2-0 Vicryl and running 4-0 Monocryl.   The hip was cleaned, dried, and dressed sterilely using Dermabond and   Aquacel dressing.  She was then brought   to recovery room in stable condition tolerating the procedure well.    Skip Mayer, PA-C was present for the entirety of the case involved from   preoperative positioning, perioperative retractor management, general   facilitation of the case, as well as primary wound closure as assistant.            Madlyn Frankel Charlann Boxer, M.D.        08/16/2014 1:16 PM

## 2014-08-16 NOTE — Anesthesia Procedure Notes (Signed)
Procedure Name: Intubation Date/Time: 08/16/2014 11:41 AM Performed by: Leroy Libman L Patient Re-evaluated:Patient Re-evaluated prior to inductionOxygen Delivery Method: Circle system utilized Preoxygenation: Pre-oxygenation with 100% oxygen Intubation Type: IV induction Ventilation: Mask ventilation without difficulty and Oral airway inserted - appropriate to patient size Laryngoscope Size: Hyacinth Meeker and 2 Grade View: Grade I Tube type: Oral Tube size: 7.5 mm Number of attempts: 1 Airway Equipment and Method: Stylet Placement Confirmation: ETT inserted through vocal cords under direct vision,  breath sounds checked- equal and bilateral and positive ETCO2 Secured at: 21 cm Tube secured with: Tape Dental Injury: Teeth and Oropharynx as per pre-operative assessment

## 2014-08-17 ENCOUNTER — Encounter (HOSPITAL_COMMUNITY): Payer: Self-pay | Admitting: Orthopedic Surgery

## 2014-08-17 LAB — BASIC METABOLIC PANEL
Anion gap: 7 (ref 5–15)
BUN: 9 mg/dL (ref 6–20)
CALCIUM: 8.3 mg/dL — AB (ref 8.9–10.3)
CHLORIDE: 106 mmol/L (ref 101–111)
CO2: 25 mmol/L (ref 22–32)
CREATININE: 0.67 mg/dL (ref 0.44–1.00)
GFR calc Af Amer: >60 mL/min (ref >60)
GFR calc non Af Amer: >60 mL/min (ref >60)
Glucose, Bld: 122 mg/dL — ABNORMAL HIGH (ref 65–99)
Potassium: 4.4 mmol/L (ref 3.5–5.1)
Sodium: 138 mmol/L (ref 135–145)

## 2014-08-17 LAB — CBC
HCT: 35 % — ABNORMAL LOW (ref 36.0–46.0)
Hemoglobin: 11.7 g/dL — ABNORMAL LOW (ref 12.0–15.0)
MCH: 31.6 pg (ref 26.0–34.0)
MCHC: 33.4 g/dL (ref 30.0–36.0)
MCV: 94.6 fL (ref 78.0–100.0)
Platelets: 156 10*3/uL (ref 150–400)
RBC: 3.7 MIL/uL — AB (ref 3.87–5.11)
RDW: 13 % (ref 11.5–15.5)
WBC: 10 10*3/uL (ref 4.0–10.5)

## 2014-08-17 MED ORDER — METHOCARBAMOL 500 MG PO TABS: 500.0000 mg | ORAL_TABLET | Freq: Four times a day (QID) | ORAL | Status: DC | PRN

## 2014-08-17 MED ORDER — ASPIRIN 325 MG PO TBEC: 325.0000 mg | DELAYED_RELEASE_TABLET | Freq: Two times a day (BID) | ORAL | Status: AC

## 2014-08-17 MED ORDER — FERROUS SULFATE 325 (65 FE) MG PO TABS: 325.0000 mg | ORAL_TABLET | Freq: Three times a day (TID) | ORAL | Status: DC

## 2014-08-17 MED ORDER — HYDROCODONE-ACETAMINOPHEN 7.5-325 MG PO TABS: 1.0000 | ORAL_TABLET | ORAL | Status: DC | PRN

## 2014-08-17 MED ORDER — DOCUSATE SODIUM 100 MG PO CAPS: 100.0000 mg | ORAL_CAPSULE | Freq: Two times a day (BID) | ORAL | Status: DC

## 2014-08-17 MED ORDER — POLYETHYLENE GLYCOL 3350 17 G PO PACK: 17.0000 g | PACK | Freq: Two times a day (BID) | ORAL | Status: DC

## 2014-08-17 NOTE — Progress Notes (Signed)
[facility]   [facility] is providing the following services: RW  If patient discharges after hours, please call [phone].   Renard Hamper 08/17/2014, 10:39 AM

## 2014-08-17 NOTE — Progress Notes (Signed)
Discharged from floor via w/c, spouse & belongings with pt. No changes in assessment. Henrik Orihuela  

## 2014-08-17 NOTE — Evaluation (Signed)
Occupational Therapy Evaluation Patient Details Name: Margaret Hancock MRN: [id number] DOB: [birth year] Today's Date: 08/17/2014    History of Present Illness L THR, DA   Clinical Impression   This 59 year old female was admitted for the above surgery.  All education was completed.  No further OT is needed at this time    Follow Up Recommendations  No OT follow up;Supervision/Assistance - 24 hour (initially)    Equipment Recommendations  None recommended by OT (pt has comfort height commode at home)    Recommendations for Other Services       Precautions / Restrictions Precautions Precautions: Fall Restrictions Weight Bearing Restrictions: No Other Position/Activity Restrictions: WBAT      Mobility Bed Mobility            General bed mobility comments: oob with PT  Transfers Overall transfer level: Needs assistance Equipment used: Rolling walker (2 wheeled) Transfers: Sit to/from Stand Sit to Stand: Min guard         General transfer comment: cues for LE management and use of UEs to self assist    Balance                                            ADL Overall ADL's : Needs assistance/impaired     Grooming: Supervision/safety;Standing                   Toilet Transfer: Min guard;Ambulation;Comfort height toilet;RW   Toileting- Clothing Manipulation and Hygiene: Supervision/safety;Sit to/from stand   Tub/ Shower Transfer: Min guard;Walk-in shower;Ambulation     General ADL Comments: Pt did not want to complete ADL this am.  She will have husband to assist as needed, and she has a reacher at home.  Pt has had 2 back sxs. She is still careful with moving.   No longer has 3:1 commode but can get on/off commode with min guard. She doesn't have anything next to commode to use for support.  Educated to use RW with one hand and reach one for commode.  Discussed single stable stool to get in/out of bed; assisting leg in/out of bed and  sidestepping through tight spaces     Vision     Perception     Praxis      Pertinent Vitals/Pain Pain Assessment: 0-10 Pain Score: 3  Pain Location: left hip Pain Descriptors / Indicators: Aching Pain Intervention(s): Limited activity within patient's tolerance;Monitored during session;Premedicated before session;Repositioned;Ice applied     Hand Dominance     Extremity/Trunk Assessment Upper Extremity Assessment Upper Extremity Assessment: Overall WFL for tasks assessed      Cervical / Trunk Assessment Cervical / Trunk Assessment: Normal   Communication Communication Communication: No difficulties   Cognition Arousal/Alertness: Awake/alert Behavior During Therapy: WFL for tasks assessed/performed Overall Cognitive Status: Within Functional Limits for tasks assessed                     General Comments       Exercises      Shoulder Instructions      Home Living Family/patient expects to be discharged to:: Private residence Living Arrangements: Spouse/significant other Available Help at Discharge: Family Type of Home: House Home Access: Stairs to enter Secretary/administrator of Steps: 1   Home Layout: One level     Bathroom Shower/Tub: Producer, television/film/video:  Handicapped height     Home Equipment: Shower seat          Prior Functioning/Environment Level of Independence: Independent             OT Diagnosis: Generalized weakness   OT Problem List:     OT Treatment/Interventions:      OT Goals(Current goals can be found in the care plan section) Acute Rehab OT Goals Patient Stated Goal: Resume previous lifestyle with decreased pain  OT Frequency:     Barriers to D/C:            Co-evaluation              End of Session    Activity Tolerance: Patient tolerated treatment well Patient left: in chair;with call bell/phone within reach;with family/visitor present   Time: 1610-9604 OT Time Calculation  (min): 26 min Charges:  1 Eval G-Codes:    Margaret Hancock 2014/09/03, 10:03 AM  Margaret Hancock, OTR/L [phone] 03-Sep-2014

## 2014-08-17 NOTE — Progress Notes (Signed)
     Subjective: 1 Day Post-Op Procedure(s) (LRB): LEFT TOTAL HIP ARTHROPLASTY ANTERIOR APPROACH (Left)   Patient reports pain as mild, pain controlled. No events throughout the night.  Ready to be discharged home if she does well with PT.  Objective:   VITALS:   Filed Vitals:   08/17/14 0524  BP: 129/59  Pulse: 85  Temp: 98.3 F (36.8 C)  Resp: 16    Dorsiflexion/Plantar flexion intact Incision: dressing C/D/I No cellulitis present Compartment soft  LABS  Recent Labs  08/17/14 0409  HGB 11.7*  HCT 35.0*  WBC 10.0  PLT 156     Recent Labs  08/17/14 0409  NA 138  K 4.4  BUN 9  CREATININE 0.67  GLUCOSE 122*     Assessment/Plan: 1 Day Post-Op Procedure(s) (LRB): LEFT TOTAL HIP ARTHROPLASTY ANTERIOR APPROACH (Left) Foley cath d/c'ed Advance diet Up with therapy D/C IV fluids Discharge home with home health  Follow up in 2 weeks at [facility]. Follow up with OLIN,Charlize Hathaway D in 2 weeks.  Contact information:  [facility] [address] [place] [address] [phone]    Obese (BMI 30-39.9) Estimated body mass index is 37.85 kg/(m^2) as calculated from the following:   Height as of this encounter:  (1.575 m).   Weight as of this encounter: 93.895 kg (207 lb). Patient also counseled that weight may inhibit the healing process Patient counseled that losing weight will help with future health issues       Anastasio Auerbach. Margaret Hancock   PAC  08/17/2014, 9:51 AM

## 2014-08-17 NOTE — Discharge Instructions (Signed)

## 2014-08-17 NOTE — Plan of Care (Signed)
Problem: Discharge Progression Outcomes Goal: Anticoagulant follow-up in place Outcome: Not Applicable Date Met:  08/17/14 asa     

## 2014-08-17 NOTE — Progress Notes (Signed)
Physical Therapy Treatment Patient Details Name: Margaret Hancock MRN: [id number] DOB: [birth year] Today's Date: 08/17/2014    History of Present Illness L THR    PT Comments    Pt motivated and progressing well.  Reviewed therex, stairs and car transfers.  Follow Up Recommendations  Home health PT     Equipment Recommendations  Rolling walker with 5" wheels    Recommendations for Other Services OT consult     Precautions / Restrictions Precautions Precautions: Fall Restrictions Weight Bearing Restrictions: No Other Position/Activity Restrictions: WBAT    Mobility  Bed Mobility                  Transfers Overall transfer level: Needs assistance Equipment used: Rolling walker (2 wheeled) Transfers: Sit to/from Stand Sit to Stand: Min guard;Supervision         General transfer comment: cues for LE management and use of UEs to self assist  Ambulation/Gait Ambulation/Gait assistance: Min guard;Supervision Ambulation Distance (Feet): 150 Feet Assistive device: Rolling walker (2 wheeled) Gait Pattern/deviations: Step-to pattern;Step-through pattern;Decreased step length - right;Decreased step length - left;Shuffle;Trunk flexed     General Gait Details: cues for posture, position from RW, ER on L and initial sequence   Stairs Stairs: Yes Stairs assistance: Min assist Stair Management: Step to pattern;Forwards;Backwards;No rails;With walker Number of Stairs: 3 General stair comments: cues for sequence and foot/RW placemen.  Single step fwd twice and bkwd once  Wheelchair Mobility    Modified Rankin (Stroke Patients Only)       Balance                                    Cognition Arousal/Alertness: Awake/alert Behavior During Therapy: WFL for tasks assessed/performed Overall Cognitive Status: Within Functional Limits for tasks assessed                      Exercises General Exercises - Lower Extremity Ankle  Circles/Pumps: AROM;Both;Supine;20 reps Quad Sets: AROM;Both;10 reps;Supine Heel Slides: AAROM;Left;20 reps;Supine Hip ABduction/ADduction: AAROM;Left;15 reps;Supine    General Comments        Pertinent Vitals/Pain Pain Assessment: 0-10 Pain Score: 3  Pain Location: L hip Pain Descriptors / Indicators: Aching;Sore Pain Intervention(s): Limited activity within patient's tolerance;Monitored during session;Premedicated before session;Ice applied    Home Living                      Prior Function            PT Goals (current goals can now be found in the care plan section) Acute Rehab PT Goals Patient Stated Goal: Resume previous lifestyle with decreased pain PT Goal Formulation: With patient Time For Goal Achievement: 08/19/14 Potential to Achieve Goals: Good Progress towards PT goals: Progressing toward goals    Frequency  7X/week    PT Plan Current plan remains appropriate    Co-evaluation             End of Session Equipment Utilized During Treatment: Gait belt Activity Tolerance: Patient tolerated treatment well Patient left: in chair;with call bell/phone within reach;with family/visitor present     Time: 7829-5621 PT Time Calculation (min) (ACUTE ONLY): 32 min  Charges:  $Gait Training: 8-22 mins $Therapeutic Exercise: 8-22 mins                    G Codes:      Margaret Hancock  08/17/2014, 5:02 PM

## 2014-08-17 NOTE — Care Management Note (Signed)
Case Management Note  Patient Details  Name: Margaret Hancock MRN: [id number] Date of Birth: [birth year]  Subjective/Objective:                   LEFT TOTAL HIP ARTHROPLASTY ANTERIOR APPROACH (Left) Action/Plan:  Discharge planning Expected Discharge Date:  08/17/14               Expected Discharge Plan:  [place]  In-House Referral:     Discharge planning Services  CM Consult  Post Acute Care Choice:  Home Health Choice offered to:  Patient  DME Arranged:  Walker rolling DME Agency:  [place]:  PT [facility] Agency:  [facility]  Status of Service:     Medicare Important Message Given:    Date Medicare IM Given:    Medicare IM give by:    Date Additional Medicare IM Given:    Additional Medicare Important Message give by:     If discussed at [place] of Stay Meetings, dates discussed:    Additional Comments: CM met with pt in room to offer choice of home health agency.  Pt chooses [facility] to render HHPT.  Address and contact information verified by pt.  Referral given to [facility] rep, Tim (on unit).  CM called [facility] DME rep, Lecretia to please deliver the rolling walker to room prior to discharge.  No other CM needs were communicated. Dellie Catholic, RN 08/17/2014, 1:40 PM

## 2014-08-17 NOTE — Evaluation (Signed)
Physical Therapy Evaluation Patient Details Name: Margaret Hancock MRN: [id number] DOB: July 13, 1955 Today's Date: 08/17/2014   History of Present Illness  L THR  Clinical Impression  Pt s/p L THR presents with decreased L LE strength/ROM and post op pain limiting functional mobility.  Pt should progress well to dc home with family assist and HHPT    Follow Up Recommendations Home health PT    Equipment Recommendations  Rolling walker with 5" wheels    Recommendations for Other Services OT consult     Precautions / Restrictions Precautions Precautions: Fall Restrictions Weight Bearing Restrictions: No Other Position/Activity Restrictions: WBAT      Mobility  Bed Mobility Overal bed mobility: Needs Assistance Bed Mobility: Supine to Sit     Supine to sit: Min assist     General bed mobility comments: cues for sequence and use of R LE to self assist  Transfers Overall transfer level: Needs assistance Equipment used: Rolling walker (2 wheeled) Transfers: Sit to/from Stand Sit to Stand: Min assist         General transfer comment: cues for LE management and use of UEs to self assist  Ambulation/Gait Ambulation/Gait assistance: Min assist Ambulation Distance (Feet): 40 Feet Assistive device: Rolling walker (2 wheeled) Gait Pattern/deviations: Step-to pattern;Step-through pattern;Decreased step length - right;Decreased step length - left;Shuffle;Trunk flexed Gait velocity: decr   General Gait Details: cues for posture, position from RW, ER on L and initial sequence  Stairs            Wheelchair Mobility    Modified Rankin (Stroke Patients Only)       Balance                                             Pertinent Vitals/Pain Pain Assessment: 0-10 Pain Score: 3  Pain Location: L HIP Pain Descriptors / Indicators: Aching;Sore Pain Intervention(s): Limited activity within patient's tolerance;Monitored during session;Premedicated  before session;Ice applied    Home Living Family/patient expects to be discharged to:: Private residence Living Arrangements: Spouse/significant other Available Help at Discharge: Family Type of Home: House Home Access: Stairs to enter   Secretary/administrator of Steps: 1 Home Layout: One level Home Equipment: None      Prior Function Level of Independence: Independent               Hand Dominance        Extremity/Trunk Assessment   Upper Extremity Assessment: Overall WFL for tasks assessed           Lower Extremity Assessment: LLE deficits/detail   LLE Deficits / Details: Hip strength 2+/5 with AAROM at hip to 90 flex and 20 abd  Cervical / Trunk Assessment: Normal  Communication   Communication: No difficulties  Cognition Arousal/Alertness: Awake/alert Behavior During Therapy: WFL for tasks assessed/performed Overall Cognitive Status: Within Functional Limits for tasks assessed                      General Comments      Exercises General Exercises - Lower Extremity Ankle Circles/Pumps: AROM;Both;Supine;20 reps Quad Sets: AROM;Both;10 reps;Supine Heel Slides: AAROM;Left;20 reps;Supine Hip ABduction/ADduction: AAROM;Left;15 reps;Supine      Assessment/Plan    PT Assessment Patient needs continued PT services  PT Diagnosis Difficulty walking   PT Problem List Decreased strength;Decreased range of motion;Decreased activity tolerance;Decreased mobility;Decreased knowledge of use of  DME;Pain  PT Treatment Interventions DME instruction;Functional mobility training;Stair training;Gait training;Therapeutic activities;Therapeutic exercise;Patient/family education   PT Goals (Current goals can be found in the Care Plan section) Acute Rehab PT Goals Patient Stated Goal: Resume previous lifestyle with decreased pain PT Goal Formulation: With patient Time For Goal Achievement: 08/19/14 Potential to Achieve Goals: Good    Frequency 7X/week    Barriers to discharge        Co-evaluation               End of Session Equipment Utilized During Treatment: Gait belt Activity Tolerance: Patient tolerated treatment well Patient left: in chair;with call bell/phone within reach;with family/visitor present Nurse Communication: Mobility status         Time: 0820-0858 PT Time Calculation (min) (ACUTE ONLY): 38 min   Charges:   PT Evaluation $Initial PT Evaluation Tier I: 1 Procedure PT Treatments $Gait Training: 8-22 mins $Therapeutic Exercise: 8-22 mins   PT G Codes:        Sundi Slevin 2014-09-12, 9:49 AM

## 2014-08-23 NOTE — Discharge Summary (Signed)
Physician Discharge Summary  Patient ID: Margaret Hancock MRN: [id number] DOB/AGE: 05/17/1955 59 y.o.  Admit date: 08/16/2014 Discharge date: 08/17/2014   Procedures:  Procedure(s) (LRB): LEFT TOTAL HIP ARTHROPLASTY ANTERIOR APPROACH (Left)  Attending Physician:  Dr. Durene Romans   Admission Diagnoses:   Left hip primary OA / pain  Discharge Diagnoses:  Principal Problem:   S/P left THA  Past Medical History  Diagnosis Date  . Hyperlipidemia   . Thyroid disease   . Hypertension   . Obesity   . Osteoarthritis   . OA (osteoarthritis)     HPI:    Margaret Hancock, 59 y.o. female, has a history of pain and functional disability in the left hip(s) due to arthritis and patient has failed non-surgical conservative treatments for greater than 12 weeks to include NSAID's and/or analgesics, corticosteriod injections and activity modification. Onset of symptoms was gradual starting 2+ years ago with gradually worsening course since that time.The patient noted no past surgery on the left hip(s). Patient currently rates pain in the left hip at 8 out of 10 with activity. Patient has worsening of pain with activity and weight bearing, trendelenberg gait, pain that interfers with activities of daily living and pain with passive range of motion. Patient has evidence of periarticular osteophytes and joint space narrowing by imaging studies. This condition presents safety issues increasing the risk of falls. There is no current active infection. Risks, benefits and expectations were discussed with the patient. Risks including but not limited to the risk of anesthesia, blood clots, nerve damage, blood vessel damage, failure of the prosthesis, infection and up to and including death. Patient understand the risks, benefits and expectations and wishes to proceed with surgery.   PCP: Frederich Chick, MD   Discharged Condition: good  Hospital Course:  Patient underwent the above stated procedure on  08/16/2014. Patient tolerated the procedure well and brought to the recovery room in good condition and subsequently to the floor.  POD #1 BP: 129/59 ; Pulse: 85 ; Temp: 98.3 F (36.8 C) ; Resp: 16 Patient reports pain as mild, pain controlled. No events throughout the night. Ready to be discharged home. Dorsiflexion/plantar flexion intact, incision: dressing C/D/I, no cellulitis present and compartment soft.   LABS  Basename    HGB  11.7  HCT  35.0    Discharge Exam: General appearance: alert, cooperative and no distress Extremities: Homans sign is negative, no sign of DVT, no edema, redness or tenderness in the calves or thighs and no ulcers, gangrene or trophic changes  Disposition: Home with follow up in 2 weeks   Follow-up Information    Follow up with [name], MD. Schedule an appointment as soon as possible for a visit in 2 weeks.   Specialty:  Orthopedic Surgery   Contact information:   [address] [place] [address] [phone]       Follow up with [facility]   Why:  home health physical therapy   Contact information:   [address] [place] [address] [phone]       Follow up with [facility]   Why:  rolling walker   Contact information:   [address] [place] [address] [phone]       Discharge Instructions    Call MD / Call 911    Complete by:  As directed   If you experience chest pain or shortness of breath, CALL  911 and be transported to the hospital emergency room.  If you develope a fever above 101 F, pus (white drainage) or increased drainage or redness at the wound, or calf pain, call your surgeon's office.     Change dressing    Complete by:  As directed   Maintain surgical dressing until follow up in the clinic. If the edges start to pull up, may reinforce with tape. If the dressing is no longer working, may remove and cover with gauze and tape, but must  keep the area dry and clean.  Call with any questions or concerns.     Constipation Prevention    Complete by:  As directed   Drink plenty of fluids.  Prune juice may be helpful.  You may use a stool softener, such as Colace (over the counter) 100 mg twice a day.  Use MiraLax (over the counter) for constipation as needed.     Diet - low sodium heart healthy    Complete by:  As directed      Discharge instructions    Complete by:  As directed   Maintain surgical dressing until follow up in the clinic. If the edges start to pull up, may reinforce with tape. If the dressing is no longer working, may remove and cover with gauze and tape, but must keep the area dry and clean.  Follow up in 2 weeks at [facility]. Call with any questions or concerns.     Increase activity slowly as tolerated    Complete by:  As directed   Weight bearing as tolerated with assist device (walker, cane, etc) as directed, use it as long as suggested by your surgeon or therapist, typically at least 4-6 weeks.     TED hose    Complete by:  As directed   Use stockings (TED hose) for 2 weeks on both leg(s).  You may remove them at night for sleeping.             Medication List    STOP taking these medications        acetaminophen 500 MG tablet  Commonly known as:  TYLENOL     traMADol 50 MG tablet  Commonly known as:  ULTRAM      TAKE these medications        amLODipine 10 MG tablet  Commonly known as:  NORVASC  Take 10 mg by mouth at bedtime.     aspirin 325 MG EC tablet  Take 1 tablet (325 mg total) by mouth 2 (two) times daily.     CALCIUM-VITAMIN D PO  Take 1 tablet by mouth daily.     DIPHENHYDRAMINE HCL (SLEEP) PO  Take 50 mg by mouth at bedtime.     docusate sodium 100 MG capsule  Commonly known as:  COLACE  Take 1 capsule (100 mg total) by mouth 2 (two) times daily.     ferrous sulfate 325 (65 FE) MG tablet  Take 1 tablet (325 mg total) by mouth 3 (three) times daily after  meals.     hydrochlorothiazide 25 MG tablet  Commonly known as:  HYDRODIURIL  Take 25 mg by mouth every morning.     HYDROcodone-acetaminophen 7.5-325 MG per tablet  Commonly known as:  NORCO  Take 1-2 tablets by mouth every 4 (four) hours as needed for moderate pain.     levothyroxine 125 MCG tablet  Commonly known as:  SYNTHROID, LEVOTHROID  Take 125 mcg by mouth daily before breakfast.  lisinopril 10 MG tablet  Commonly known as:  PRINIVIL,ZESTRIL  Take 10 mg by mouth at bedtime.     methocarbamol 500 MG tablet  Commonly known as:  ROBAXIN  Take 1 tablet (500 mg total) by mouth every 6 (six) hours as needed for muscle spasms.     multivitamin tablet  Take 1 tablet by mouth daily.     omeprazole 20 MG capsule  Commonly known as:  PRILOSEC  Take 20 mg by mouth at bedtime.     polyethylene glycol packet  Commonly known as:  MIRALAX / GLYCOLAX  Take 17 g by mouth 2 (two) times daily.     simvastatin 80 MG tablet  Commonly known as:  ZOCOR  Take 80 mg by mouth at bedtime.     vitamin E 400 UNIT capsule  Take 400 Units by mouth daily.         Signed: Anastasio Auerbach. Makenley Shimp   PA-C  08/23/2014, 12:08 PM

## 2015-05-22 ENCOUNTER — Other Ambulatory Visit (HOSPITAL_BASED_OUTPATIENT_CLINIC_OR_DEPARTMENT_OTHER): Payer: Self-pay | Admitting: Family Medicine

## 2015-05-22 DIAGNOSIS — Z1231 Encounter for screening mammogram for malignant neoplasm of breast: Secondary | ICD-10-CM

## 2015-06-15 ENCOUNTER — Ambulatory Visit (HOSPITAL_BASED_OUTPATIENT_CLINIC_OR_DEPARTMENT_OTHER)
Admission: RE | Admit: 2015-06-15 | Discharge: 2015-06-15 | Disposition: A | Discharge status: Home / Self Care | Payer: BC Managed Care – PPO | Source: Ambulatory Visit | Attending: Family Medicine | Admitting: Family Medicine

## 2015-06-15 DIAGNOSIS — Z1231 Encounter for screening mammogram for malignant neoplasm of breast: Secondary | ICD-10-CM | POA: Diagnosis not present

## 2015-12-26 ENCOUNTER — Other Ambulatory Visit (HOSPITAL_COMMUNITY)
Admission: RE | Admit: 2015-12-26 | Discharge: 2015-12-26 | Disposition: A | Discharge status: Home / Self Care | Payer: BC Managed Care – PPO | Source: Ambulatory Visit | Attending: Family Medicine | Admitting: Family Medicine

## 2015-12-26 ENCOUNTER — Other Ambulatory Visit: Payer: Self-pay | Admitting: Family Medicine

## 2015-12-26 DIAGNOSIS — Z1151 Encounter for screening for human papillomavirus (HPV): Secondary | ICD-10-CM | POA: Insufficient documentation

## 2015-12-26 DIAGNOSIS — Z01411 Encounter for gynecological examination (general) (routine) with abnormal findings: Secondary | ICD-10-CM | POA: Insufficient documentation

## 2015-12-27 LAB — CYTOLOGY - PAP
Diagnosis: NEGATIVE
HPV: NOT DETECTED

## 2016-05-10 ENCOUNTER — Other Ambulatory Visit (HOSPITAL_BASED_OUTPATIENT_CLINIC_OR_DEPARTMENT_OTHER): Payer: Self-pay | Admitting: Family Medicine

## 2016-05-10 DIAGNOSIS — Z1231 Encounter for screening mammogram for malignant neoplasm of breast: Secondary | ICD-10-CM

## 2016-06-20 ENCOUNTER — Ambulatory Visit (HOSPITAL_BASED_OUTPATIENT_CLINIC_OR_DEPARTMENT_OTHER)
Admission: RE | Admit: 2016-06-20 | Discharge: 2016-06-20 | Disposition: A | Discharge status: Home / Self Care | Payer: BC Managed Care – PPO | Source: Ambulatory Visit | Attending: Family Medicine | Admitting: Family Medicine

## 2016-06-20 DIAGNOSIS — Z1231 Encounter for screening mammogram for malignant neoplasm of breast: Secondary | ICD-10-CM | POA: Diagnosis not present

## 2017-05-12 ENCOUNTER — Other Ambulatory Visit (HOSPITAL_BASED_OUTPATIENT_CLINIC_OR_DEPARTMENT_OTHER): Payer: Self-pay | Admitting: Family Medicine

## 2017-05-12 DIAGNOSIS — Z1231 Encounter for screening mammogram for malignant neoplasm of breast: Secondary | ICD-10-CM

## 2017-06-24 ENCOUNTER — Ambulatory Visit (HOSPITAL_BASED_OUTPATIENT_CLINIC_OR_DEPARTMENT_OTHER)
Admission: RE | Admit: 2017-06-24 | Discharge: 2017-06-24 | Disposition: A | Discharge status: Home / Self Care | Payer: BC Managed Care – PPO | Source: Ambulatory Visit | Attending: Family Medicine | Admitting: Family Medicine

## 2017-06-24 DIAGNOSIS — Z1231 Encounter for screening mammogram for malignant neoplasm of breast: Secondary | ICD-10-CM

## 2018-06-04 ENCOUNTER — Other Ambulatory Visit (HOSPITAL_BASED_OUTPATIENT_CLINIC_OR_DEPARTMENT_OTHER): Payer: Self-pay | Admitting: Family Medicine

## 2018-06-04 DIAGNOSIS — Z1231 Encounter for screening mammogram for malignant neoplasm of breast: Secondary | ICD-10-CM

## 2018-06-16 DIAGNOSIS — M25562 Pain in left knee: Secondary | ICD-10-CM | POA: Insufficient documentation

## 2018-07-02 ENCOUNTER — Ambulatory Visit (HOSPITAL_BASED_OUTPATIENT_CLINIC_OR_DEPARTMENT_OTHER)
Admission: RE | Admit: 2018-07-02 | Discharge: 2018-07-02 | Disposition: A | Discharge status: Home / Self Care | Payer: BC Managed Care – PPO | Source: Ambulatory Visit | Attending: Family Medicine | Admitting: Family Medicine

## 2018-07-02 ENCOUNTER — Ambulatory Visit (HOSPITAL_BASED_OUTPATIENT_CLINIC_OR_DEPARTMENT_OTHER): Payer: BC Managed Care – PPO

## 2018-07-02 ENCOUNTER — Encounter (HOSPITAL_BASED_OUTPATIENT_CLINIC_OR_DEPARTMENT_OTHER): Payer: Self-pay

## 2018-07-02 ENCOUNTER — Other Ambulatory Visit: Payer: Self-pay

## 2018-07-02 DIAGNOSIS — Z1231 Encounter for screening mammogram for malignant neoplasm of breast: Secondary | ICD-10-CM | POA: Diagnosis not present

## 2018-07-02 IMAGING — MG DIGITAL SCREENING BILATERAL MAMMOGRAM WITH TOMO AND CAD
8 series · 8 of 24 positions shown · non-contrast
Comparison: Previous exam(s).

CLINICAL DATA: Screening.

EXAM:
DIGITAL SCREENING BILATERAL MAMMOGRAM WITH TOMO AND CAD

[R MLO synth-2D]
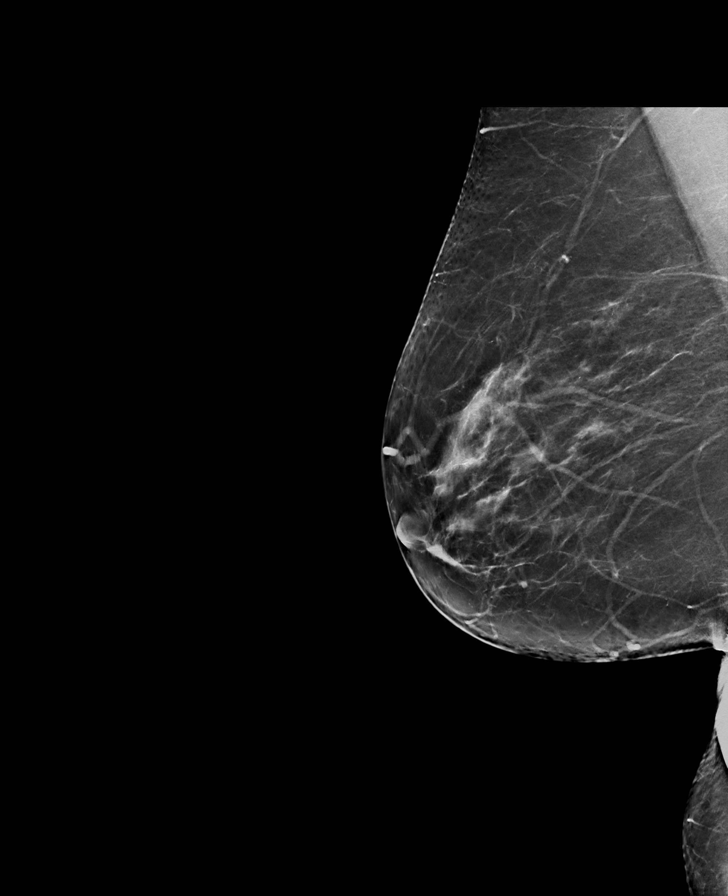

[R CC synth-2D]
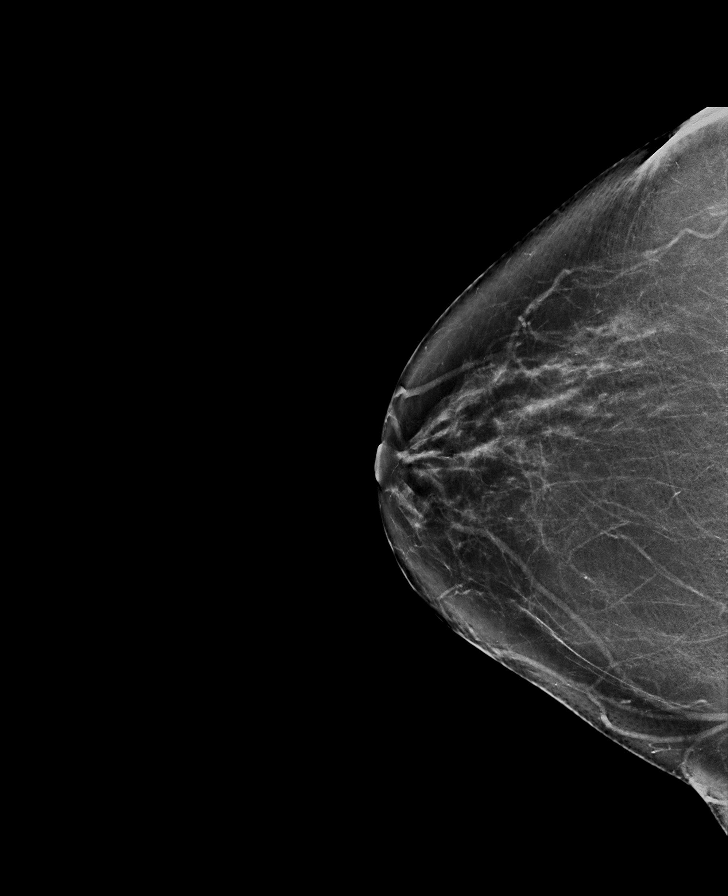

[L CC synth-2D]
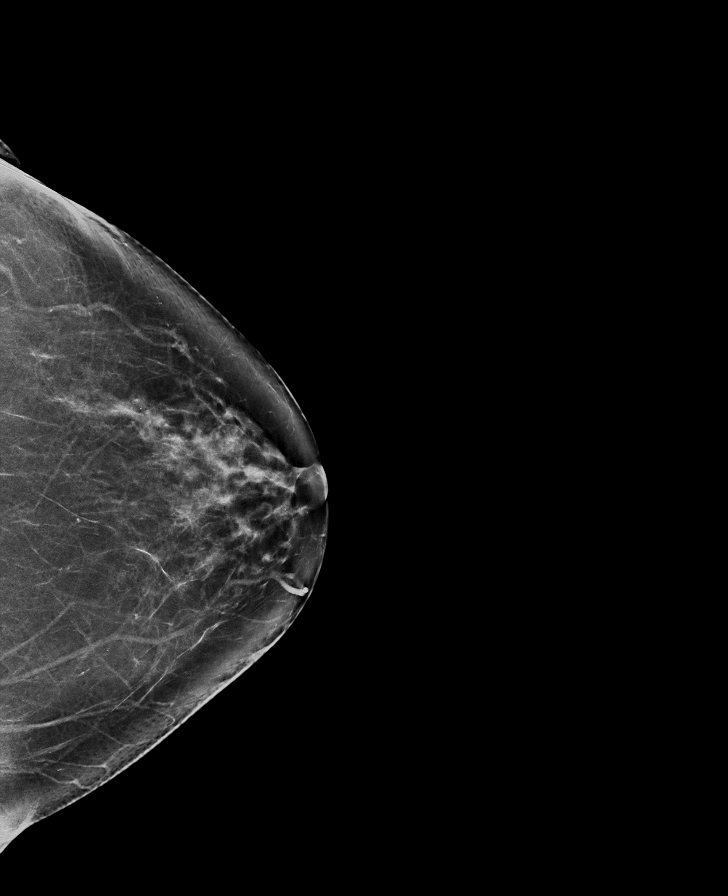

[L MLO synth-2D]
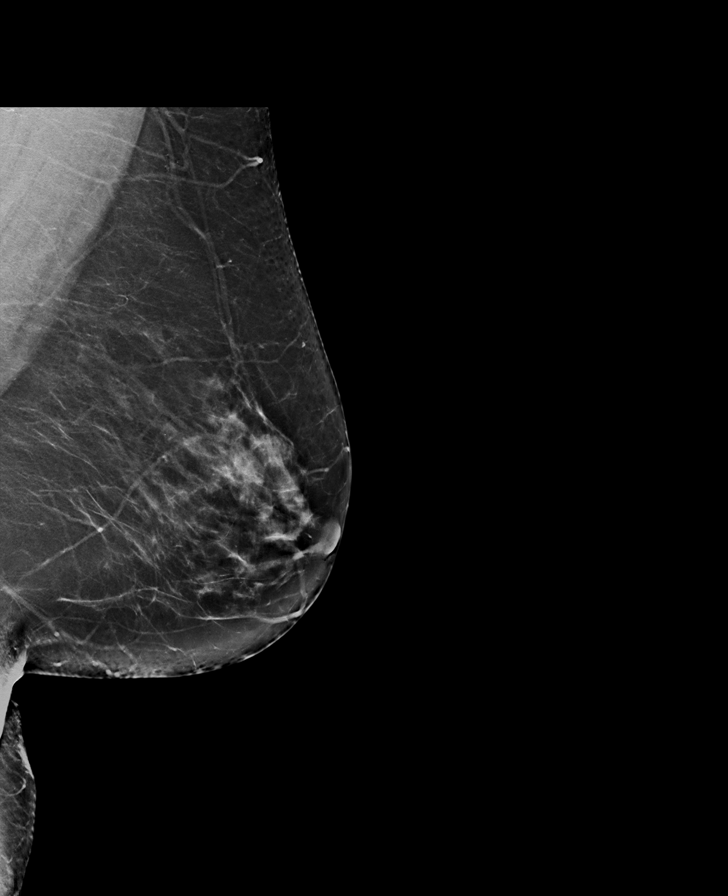

[R CC tomo · tomo slice 39/76.0]
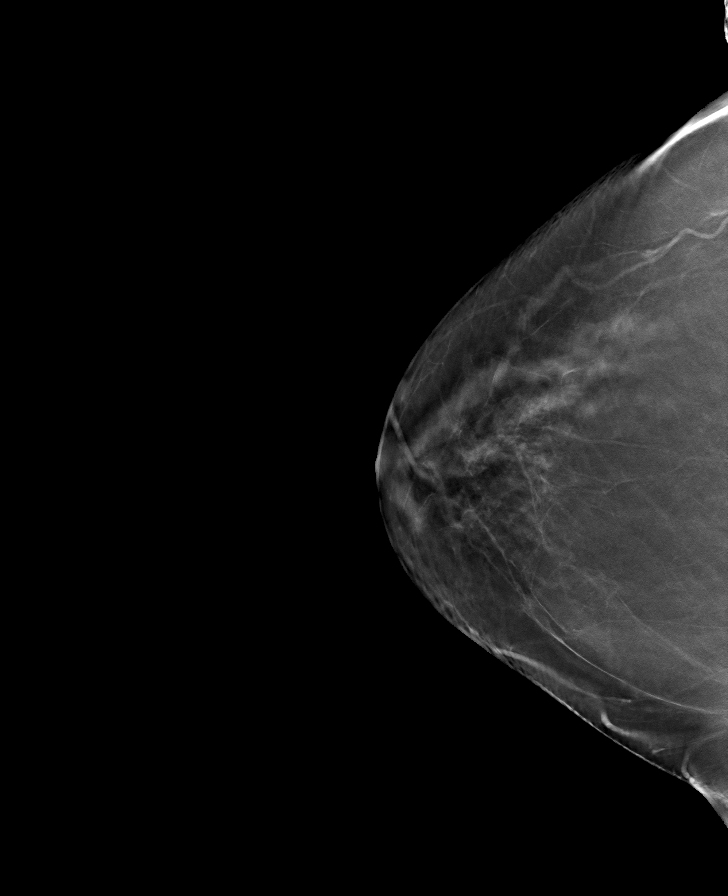

[L MLO tomo · tomo slice 41/81.0]
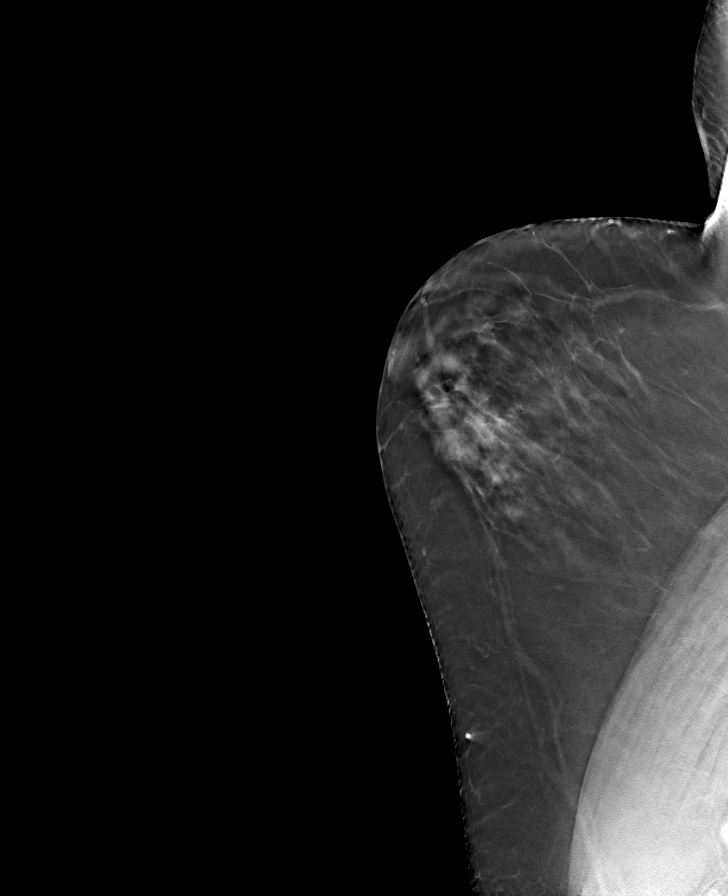

[L CC tomo · tomo slice 39/78.0]
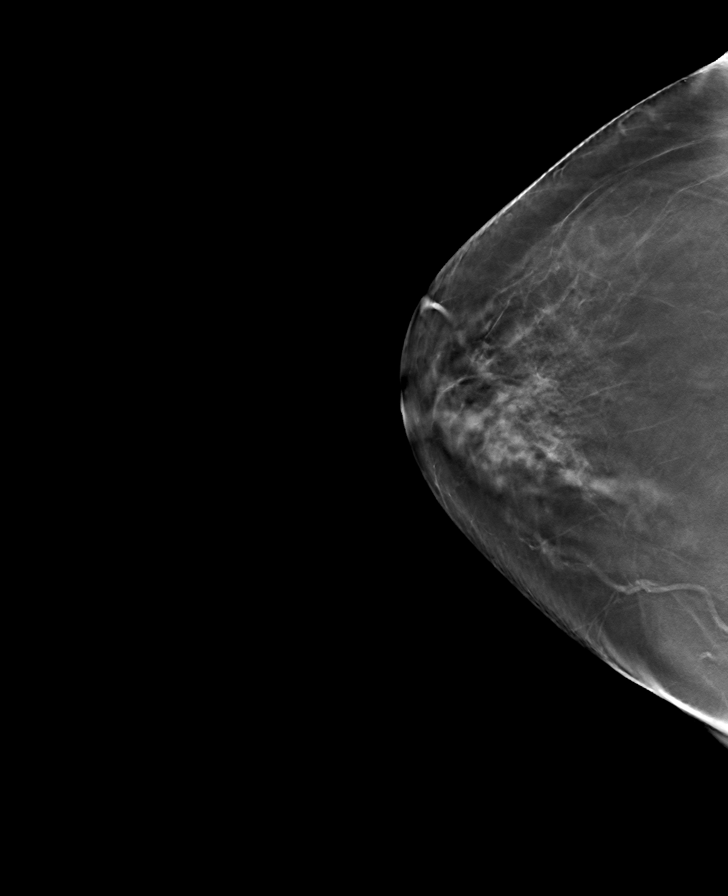

[R MLO tomo · tomo slice 38/75.0]
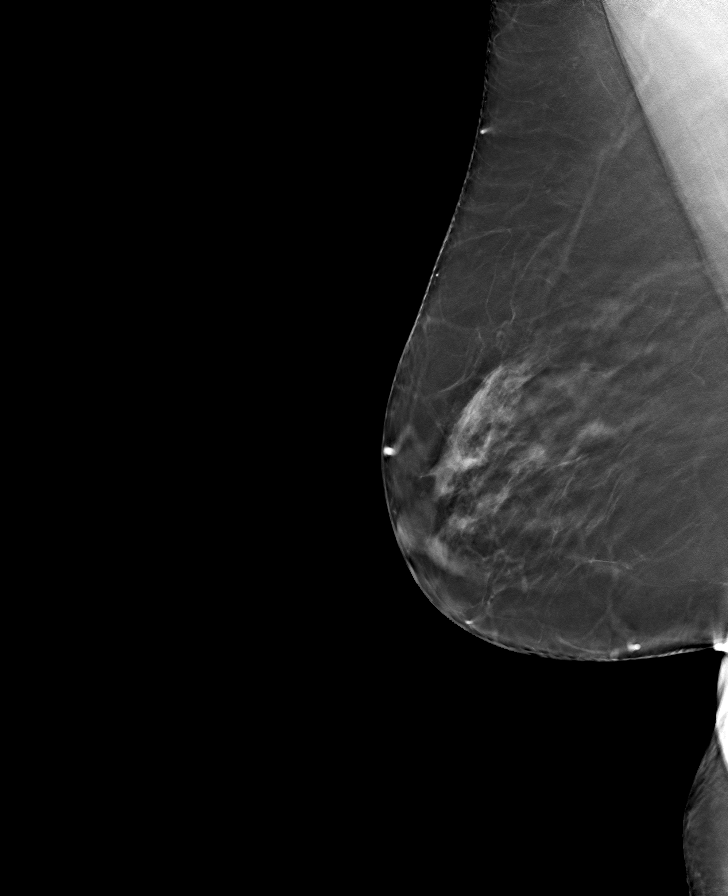

[8 of 24 positions shown; findings below may reference images not displayed]

ACR Breast Density Category c: The breast tissue is heterogeneously
dense, which may obscure small masses.
FINDINGS: There are no findings suspicious for malignancy. Images were
processed with CAD.
IMPRESSION: No mammographic evidence of malignancy. A result letter of this
screening mammogram will be mailed directly to the patient.

RECOMMENDATION:
Screening mammogram in one year. (Code:[5V])

BI-RADS CATEGORY  1: Negative.

## 2018-07-23 ENCOUNTER — Other Ambulatory Visit (HOSPITAL_BASED_OUTPATIENT_CLINIC_OR_DEPARTMENT_OTHER): Payer: Self-pay | Admitting: Family Medicine

## 2019-02-25 DIAGNOSIS — M25511 Pain in right shoulder: Secondary | ICD-10-CM | POA: Insufficient documentation

## 2019-03-05 DIAGNOSIS — M7541 Impingement syndrome of right shoulder: Secondary | ICD-10-CM | POA: Insufficient documentation

## 2019-03-11 ENCOUNTER — Other Ambulatory Visit: Payer: Self-pay | Admitting: Family Medicine

## 2019-03-11 ENCOUNTER — Other Ambulatory Visit (HOSPITAL_COMMUNITY)
Admission: RE | Admit: 2019-03-11 | Discharge: 2019-03-11 | Disposition: A | Discharge status: Home / Self Care | Payer: Medicare PPO | Source: Ambulatory Visit | Attending: Family Medicine | Admitting: Family Medicine

## 2019-03-11 DIAGNOSIS — Z124 Encounter for screening for malignant neoplasm of cervix: Secondary | ICD-10-CM | POA: Diagnosis present

## 2019-03-11 DIAGNOSIS — Z1151 Encounter for screening for human papillomavirus (HPV): Secondary | ICD-10-CM | POA: Diagnosis not present

## 2019-03-23 LAB — CYTOLOGY - PAP
Comment: NEGATIVE
Diagnosis: NEGATIVE
High risk HPV: NEGATIVE

## 2019-05-28 ENCOUNTER — Other Ambulatory Visit (HOSPITAL_BASED_OUTPATIENT_CLINIC_OR_DEPARTMENT_OTHER): Payer: Self-pay | Admitting: Family Medicine

## 2019-05-28 DIAGNOSIS — Z1231 Encounter for screening mammogram for malignant neoplasm of breast: Secondary | ICD-10-CM

## 2019-06-16 DIAGNOSIS — Z23 Encounter for immunization: Secondary | ICD-10-CM | POA: Diagnosis not present

## 2019-07-01 DIAGNOSIS — H02889 Meibomian gland dysfunction of unspecified eye, unspecified eyelid: Secondary | ICD-10-CM | POA: Diagnosis not present

## 2019-07-01 DIAGNOSIS — H25813 Combined forms of age-related cataract, bilateral: Secondary | ICD-10-CM | POA: Diagnosis not present

## 2019-07-01 DIAGNOSIS — H04123 Dry eye syndrome of bilateral lacrimal glands: Secondary | ICD-10-CM | POA: Diagnosis not present

## 2019-07-01 DIAGNOSIS — H524 Presbyopia: Secondary | ICD-10-CM | POA: Diagnosis not present

## 2019-07-09 ENCOUNTER — Other Ambulatory Visit: Payer: Self-pay

## 2019-07-09 ENCOUNTER — Ambulatory Visit (HOSPITAL_BASED_OUTPATIENT_CLINIC_OR_DEPARTMENT_OTHER)
Admission: RE | Admit: 2019-07-09 | Discharge: 2019-07-09 | Disposition: A | Discharge status: Home / Self Care | Payer: Medicare PPO | Source: Ambulatory Visit | Attending: Family Medicine | Admitting: Family Medicine

## 2019-07-09 DIAGNOSIS — Z1231 Encounter for screening mammogram for malignant neoplasm of breast: Secondary | ICD-10-CM | POA: Diagnosis not present

## 2019-07-22 DIAGNOSIS — Z20822 Contact with and (suspected) exposure to covid-19: Secondary | ICD-10-CM | POA: Diagnosis not present

## 2019-07-22 DIAGNOSIS — R05 Cough: Secondary | ICD-10-CM | POA: Diagnosis not present

## 2019-08-11 DIAGNOSIS — Z808 Family history of malignant neoplasm of other organs or systems: Secondary | ICD-10-CM | POA: Diagnosis not present

## 2019-08-11 DIAGNOSIS — L814 Other melanin hyperpigmentation: Secondary | ICD-10-CM | POA: Diagnosis not present

## 2019-08-11 DIAGNOSIS — D692 Other nonthrombocytopenic purpura: Secondary | ICD-10-CM | POA: Diagnosis not present

## 2019-08-11 DIAGNOSIS — L821 Other seborrheic keratosis: Secondary | ICD-10-CM | POA: Diagnosis not present

## 2019-09-30 DIAGNOSIS — K219 Gastro-esophageal reflux disease without esophagitis: Secondary | ICD-10-CM | POA: Diagnosis not present

## 2019-09-30 DIAGNOSIS — E785 Hyperlipidemia, unspecified: Secondary | ICD-10-CM | POA: Diagnosis not present

## 2019-09-30 DIAGNOSIS — H04129 Dry eye syndrome of unspecified lacrimal gland: Secondary | ICD-10-CM | POA: Diagnosis not present

## 2019-09-30 DIAGNOSIS — Z96649 Presence of unspecified artificial hip joint: Secondary | ICD-10-CM | POA: Diagnosis not present

## 2019-09-30 DIAGNOSIS — Z833 Family history of diabetes mellitus: Secondary | ICD-10-CM | POA: Diagnosis not present

## 2019-09-30 DIAGNOSIS — I10 Essential (primary) hypertension: Secondary | ICD-10-CM | POA: Diagnosis not present

## 2019-09-30 DIAGNOSIS — Z6841 Body Mass Index (BMI) 40.0 and over, adult: Secondary | ICD-10-CM | POA: Diagnosis not present

## 2019-09-30 DIAGNOSIS — M1990 Unspecified osteoarthritis, unspecified site: Secondary | ICD-10-CM | POA: Diagnosis not present

## 2019-09-30 DIAGNOSIS — Z791 Long term (current) use of non-steroidal anti-inflammatories (NSAID): Secondary | ICD-10-CM | POA: Diagnosis not present

## 2019-09-30 DIAGNOSIS — E039 Hypothyroidism, unspecified: Secondary | ICD-10-CM | POA: Diagnosis not present

## 2019-12-23 DIAGNOSIS — Z7189 Other specified counseling: Secondary | ICD-10-CM | POA: Diagnosis not present

## 2020-01-03 DIAGNOSIS — H25813 Combined forms of age-related cataract, bilateral: Secondary | ICD-10-CM | POA: Diagnosis not present

## 2020-01-03 DIAGNOSIS — H02889 Meibomian gland dysfunction of unspecified eye, unspecified eyelid: Secondary | ICD-10-CM | POA: Diagnosis not present

## 2020-01-03 DIAGNOSIS — H04123 Dry eye syndrome of bilateral lacrimal glands: Secondary | ICD-10-CM | POA: Diagnosis not present

## 2020-01-26 DIAGNOSIS — Z6835 Body mass index (BMI) 35.0-35.9, adult: Secondary | ICD-10-CM | POA: Diagnosis not present

## 2020-01-26 DIAGNOSIS — Z713 Dietary counseling and surveillance: Secondary | ICD-10-CM | POA: Diagnosis not present

## 2020-03-05 DIAGNOSIS — R3915 Urgency of urination: Secondary | ICD-10-CM | POA: Diagnosis not present

## 2020-03-17 DIAGNOSIS — R739 Hyperglycemia, unspecified: Secondary | ICD-10-CM | POA: Diagnosis not present

## 2020-03-17 DIAGNOSIS — E785 Hyperlipidemia, unspecified: Secondary | ICD-10-CM | POA: Diagnosis not present

## 2020-03-17 DIAGNOSIS — Z0000 Encounter for general adult medical examination without abnormal findings: Secondary | ICD-10-CM | POA: Diagnosis not present

## 2020-03-17 DIAGNOSIS — I10 Essential (primary) hypertension: Secondary | ICD-10-CM | POA: Diagnosis not present

## 2020-03-17 DIAGNOSIS — E039 Hypothyroidism, unspecified: Secondary | ICD-10-CM | POA: Diagnosis not present

## 2020-03-20 ENCOUNTER — Other Ambulatory Visit: Payer: Self-pay | Admitting: Family Medicine

## 2020-03-20 DIAGNOSIS — E2839 Other primary ovarian failure: Secondary | ICD-10-CM

## 2020-05-13 DIAGNOSIS — M1711 Unilateral primary osteoarthritis, right knee: Secondary | ICD-10-CM | POA: Diagnosis not present

## 2020-05-13 DIAGNOSIS — S86111A Strain of other muscle(s) and tendon(s) of posterior muscle group at lower leg level, right leg, initial encounter: Secondary | ICD-10-CM | POA: Diagnosis not present

## 2020-05-13 DIAGNOSIS — M25561 Pain in right knee: Secondary | ICD-10-CM | POA: Insufficient documentation

## 2020-05-19 DIAGNOSIS — R300 Dysuria: Secondary | ICD-10-CM | POA: Diagnosis not present

## 2020-05-25 ENCOUNTER — Other Ambulatory Visit (HOSPITAL_BASED_OUTPATIENT_CLINIC_OR_DEPARTMENT_OTHER): Payer: Self-pay | Admitting: Family Medicine

## 2020-05-25 DIAGNOSIS — Z1231 Encounter for screening mammogram for malignant neoplasm of breast: Secondary | ICD-10-CM

## 2020-07-11 ENCOUNTER — Ambulatory Visit (HOSPITAL_BASED_OUTPATIENT_CLINIC_OR_DEPARTMENT_OTHER): Payer: Medicare PPO

## 2020-07-18 ENCOUNTER — Ambulatory Visit (HOSPITAL_BASED_OUTPATIENT_CLINIC_OR_DEPARTMENT_OTHER)
Admission: RE | Admit: 2020-07-18 | Discharge: 2020-07-18 | Disposition: A | Discharge status: Home / Self Care | Payer: Medicare PPO | Source: Ambulatory Visit | Attending: Family Medicine | Admitting: Family Medicine

## 2020-07-18 ENCOUNTER — Encounter (HOSPITAL_BASED_OUTPATIENT_CLINIC_OR_DEPARTMENT_OTHER): Payer: Self-pay

## 2020-07-18 ENCOUNTER — Other Ambulatory Visit: Payer: Self-pay

## 2020-07-18 DIAGNOSIS — Z1231 Encounter for screening mammogram for malignant neoplasm of breast: Secondary | ICD-10-CM | POA: Insufficient documentation

## 2020-08-09 DIAGNOSIS — K644 Residual hemorrhoidal skin tags: Secondary | ICD-10-CM | POA: Diagnosis not present

## 2020-08-15 DIAGNOSIS — Z808 Family history of malignant neoplasm of other organs or systems: Secondary | ICD-10-CM | POA: Diagnosis not present

## 2020-08-15 DIAGNOSIS — D225 Melanocytic nevi of trunk: Secondary | ICD-10-CM | POA: Diagnosis not present

## 2020-08-15 DIAGNOSIS — D692 Other nonthrombocytopenic purpura: Secondary | ICD-10-CM | POA: Diagnosis not present

## 2020-08-15 DIAGNOSIS — L578 Other skin changes due to chronic exposure to nonionizing radiation: Secondary | ICD-10-CM | POA: Diagnosis not present

## 2020-08-15 DIAGNOSIS — L821 Other seborrheic keratosis: Secondary | ICD-10-CM | POA: Diagnosis not present

## 2020-08-15 DIAGNOSIS — L814 Other melanin hyperpigmentation: Secondary | ICD-10-CM | POA: Diagnosis not present

## 2020-09-06 ENCOUNTER — Ambulatory Visit
Admission: RE | Admit: 2020-09-06 | Discharge: 2020-09-06 | Disposition: A | Discharge status: Home / Self Care | Payer: Medicare PPO | Source: Ambulatory Visit | Attending: Family Medicine | Admitting: Family Medicine

## 2020-09-06 ENCOUNTER — Other Ambulatory Visit: Payer: Self-pay

## 2020-09-06 DIAGNOSIS — Z780 Asymptomatic menopausal state: Secondary | ICD-10-CM | POA: Diagnosis not present

## 2020-09-06 DIAGNOSIS — E2839 Other primary ovarian failure: Secondary | ICD-10-CM

## 2020-09-08 DIAGNOSIS — G4733 Obstructive sleep apnea (adult) (pediatric): Secondary | ICD-10-CM | POA: Diagnosis not present

## 2020-09-08 DIAGNOSIS — E039 Hypothyroidism, unspecified: Secondary | ICD-10-CM | POA: Diagnosis not present

## 2020-09-08 DIAGNOSIS — R739 Hyperglycemia, unspecified: Secondary | ICD-10-CM | POA: Diagnosis not present

## 2020-09-08 DIAGNOSIS — I10 Essential (primary) hypertension: Secondary | ICD-10-CM | POA: Diagnosis not present

## 2020-09-08 DIAGNOSIS — E785 Hyperlipidemia, unspecified: Secondary | ICD-10-CM | POA: Diagnosis not present

## 2020-09-13 DIAGNOSIS — E039 Hypothyroidism, unspecified: Secondary | ICD-10-CM | POA: Diagnosis not present

## 2020-09-13 DIAGNOSIS — R739 Hyperglycemia, unspecified: Secondary | ICD-10-CM | POA: Diagnosis not present

## 2020-09-13 DIAGNOSIS — I10 Essential (primary) hypertension: Secondary | ICD-10-CM | POA: Diagnosis not present

## 2020-09-13 DIAGNOSIS — E785 Hyperlipidemia, unspecified: Secondary | ICD-10-CM | POA: Diagnosis not present

## 2020-10-12 DIAGNOSIS — E8881 Metabolic syndrome: Secondary | ICD-10-CM | POA: Diagnosis not present

## 2020-10-12 DIAGNOSIS — M1990 Unspecified osteoarthritis, unspecified site: Secondary | ICD-10-CM | POA: Diagnosis not present

## 2020-10-17 DIAGNOSIS — L299 Pruritus, unspecified: Secondary | ICD-10-CM | POA: Diagnosis not present

## 2020-10-17 DIAGNOSIS — L237 Allergic contact dermatitis due to plants, except food: Secondary | ICD-10-CM | POA: Diagnosis not present

## 2020-10-17 DIAGNOSIS — R21 Rash and other nonspecific skin eruption: Secondary | ICD-10-CM | POA: Diagnosis not present

## 2020-10-23 DIAGNOSIS — R8290 Unspecified abnormal findings in urine: Secondary | ICD-10-CM | POA: Diagnosis not present

## 2020-10-23 DIAGNOSIS — N390 Urinary tract infection, site not specified: Secondary | ICD-10-CM | POA: Diagnosis not present

## 2020-10-26 DIAGNOSIS — M25561 Pain in right knee: Secondary | ICD-10-CM | POA: Diagnosis not present

## 2020-10-26 DIAGNOSIS — M1711 Unilateral primary osteoarthritis, right knee: Secondary | ICD-10-CM | POA: Diagnosis not present

## 2020-12-25 DIAGNOSIS — R399 Unspecified symptoms and signs involving the genitourinary system: Secondary | ICD-10-CM | POA: Diagnosis not present

## 2020-12-25 DIAGNOSIS — N3001 Acute cystitis with hematuria: Secondary | ICD-10-CM | POA: Diagnosis not present

## 2021-01-01 DIAGNOSIS — Z23 Encounter for immunization: Secondary | ICD-10-CM | POA: Diagnosis not present

## 2021-01-01 DIAGNOSIS — N390 Urinary tract infection, site not specified: Secondary | ICD-10-CM | POA: Diagnosis not present

## 2021-01-04 DIAGNOSIS — H04129 Dry eye syndrome of unspecified lacrimal gland: Secondary | ICD-10-CM | POA: Diagnosis not present

## 2021-01-04 DIAGNOSIS — E039 Hypothyroidism, unspecified: Secondary | ICD-10-CM | POA: Diagnosis not present

## 2021-01-04 DIAGNOSIS — I10 Essential (primary) hypertension: Secondary | ICD-10-CM | POA: Diagnosis not present

## 2021-01-04 DIAGNOSIS — G4700 Insomnia, unspecified: Secondary | ICD-10-CM | POA: Diagnosis not present

## 2021-01-04 DIAGNOSIS — J309 Allergic rhinitis, unspecified: Secondary | ICD-10-CM | POA: Diagnosis not present

## 2021-01-04 DIAGNOSIS — G8929 Other chronic pain: Secondary | ICD-10-CM | POA: Diagnosis not present

## 2021-01-04 DIAGNOSIS — E119 Type 2 diabetes mellitus without complications: Secondary | ICD-10-CM | POA: Diagnosis not present

## 2021-01-04 DIAGNOSIS — E785 Hyperlipidemia, unspecified: Secondary | ICD-10-CM | POA: Diagnosis not present

## 2021-01-31 ENCOUNTER — Other Ambulatory Visit (HOSPITAL_BASED_OUTPATIENT_CLINIC_OR_DEPARTMENT_OTHER): Payer: Self-pay

## 2021-01-31 MED ORDER — MOUNJARO 10 MG/0.5ML ~~LOC~~ SOAJ
10.0000 mg | SUBCUTANEOUS | 2 refills | Status: DC
  Filled 2021-01-31: qty 2, 28d supply, fill #0
  Filled 2021-02-28: qty 2, 28d supply, fill #1
  Filled 2021-03-25: qty 2, 28d supply, fill #2

## 2021-02-16 DIAGNOSIS — M25561 Pain in right knee: Secondary | ICD-10-CM | POA: Diagnosis not present

## 2021-02-16 DIAGNOSIS — M1711 Unilateral primary osteoarthritis, right knee: Secondary | ICD-10-CM | POA: Diagnosis not present

## 2021-02-28 ENCOUNTER — Other Ambulatory Visit (HOSPITAL_BASED_OUTPATIENT_CLINIC_OR_DEPARTMENT_OTHER): Payer: Self-pay

## 2021-03-22 DIAGNOSIS — E039 Hypothyroidism, unspecified: Secondary | ICD-10-CM | POA: Diagnosis not present

## 2021-03-22 DIAGNOSIS — E785 Hyperlipidemia, unspecified: Secondary | ICD-10-CM | POA: Diagnosis not present

## 2021-03-22 DIAGNOSIS — R739 Hyperglycemia, unspecified: Secondary | ICD-10-CM | POA: Diagnosis not present

## 2021-03-22 DIAGNOSIS — I10 Essential (primary) hypertension: Secondary | ICD-10-CM | POA: Diagnosis not present

## 2021-03-22 DIAGNOSIS — Z0000 Encounter for general adult medical examination without abnormal findings: Secondary | ICD-10-CM | POA: Diagnosis not present

## 2021-03-22 DIAGNOSIS — E8881 Metabolic syndrome: Secondary | ICD-10-CM | POA: Diagnosis not present

## 2021-03-26 ENCOUNTER — Other Ambulatory Visit (HOSPITAL_BASED_OUTPATIENT_CLINIC_OR_DEPARTMENT_OTHER): Payer: Self-pay

## 2021-04-30 ENCOUNTER — Other Ambulatory Visit (HOSPITAL_BASED_OUTPATIENT_CLINIC_OR_DEPARTMENT_OTHER): Payer: Self-pay

## 2021-04-30 MED ORDER — MOUNJARO 10 MG/0.5ML ~~LOC~~ SOAJ
SUBCUTANEOUS | 1 refills | Status: DC
  Filled 2021-04-30: qty 6, 84d supply, fill #0
  Filled 2021-07-11: qty 6, 84d supply, fill #1

## 2021-05-08 DIAGNOSIS — Z86010 Personal history of colonic polyps: Secondary | ICD-10-CM | POA: Diagnosis not present

## 2021-05-08 DIAGNOSIS — K5730 Diverticulosis of large intestine without perforation or abscess without bleeding: Secondary | ICD-10-CM | POA: Diagnosis not present

## 2021-06-04 ENCOUNTER — Other Ambulatory Visit (HOSPITAL_BASED_OUTPATIENT_CLINIC_OR_DEPARTMENT_OTHER): Payer: Self-pay

## 2021-06-21 ENCOUNTER — Other Ambulatory Visit (HOSPITAL_BASED_OUTPATIENT_CLINIC_OR_DEPARTMENT_OTHER): Payer: Self-pay | Admitting: Family Medicine

## 2021-06-21 DIAGNOSIS — Z1231 Encounter for screening mammogram for malignant neoplasm of breast: Secondary | ICD-10-CM

## 2021-06-27 DIAGNOSIS — M1711 Unilateral primary osteoarthritis, right knee: Secondary | ICD-10-CM | POA: Diagnosis not present

## 2021-07-11 ENCOUNTER — Other Ambulatory Visit (HOSPITAL_BASED_OUTPATIENT_CLINIC_OR_DEPARTMENT_OTHER): Payer: Self-pay

## 2021-07-12 ENCOUNTER — Other Ambulatory Visit (HOSPITAL_BASED_OUTPATIENT_CLINIC_OR_DEPARTMENT_OTHER): Payer: Self-pay

## 2021-07-23 ENCOUNTER — Ambulatory Visit (HOSPITAL_BASED_OUTPATIENT_CLINIC_OR_DEPARTMENT_OTHER)
Admission: RE | Admit: 2021-07-23 | Discharge: 2021-07-23 | Disposition: A | Discharge status: Home / Self Care | Payer: Medicare PPO | Source: Ambulatory Visit | Attending: Family Medicine | Admitting: Family Medicine

## 2021-07-23 ENCOUNTER — Other Ambulatory Visit (HOSPITAL_BASED_OUTPATIENT_CLINIC_OR_DEPARTMENT_OTHER): Payer: Self-pay

## 2021-07-23 ENCOUNTER — Encounter (HOSPITAL_BASED_OUTPATIENT_CLINIC_OR_DEPARTMENT_OTHER): Payer: Self-pay

## 2021-07-23 DIAGNOSIS — Z1231 Encounter for screening mammogram for malignant neoplasm of breast: Secondary | ICD-10-CM | POA: Insufficient documentation

## 2021-08-10 DIAGNOSIS — M1711 Unilateral primary osteoarthritis, right knee: Secondary | ICD-10-CM | POA: Diagnosis not present

## 2021-08-13 DIAGNOSIS — L659 Nonscarring hair loss, unspecified: Secondary | ICD-10-CM | POA: Diagnosis not present

## 2021-08-13 DIAGNOSIS — I10 Essential (primary) hypertension: Secondary | ICD-10-CM | POA: Diagnosis not present

## 2021-08-13 DIAGNOSIS — E039 Hypothyroidism, unspecified: Secondary | ICD-10-CM | POA: Diagnosis not present

## 2021-08-17 DIAGNOSIS — M1711 Unilateral primary osteoarthritis, right knee: Secondary | ICD-10-CM | POA: Diagnosis not present

## 2021-08-24 DIAGNOSIS — M1711 Unilateral primary osteoarthritis, right knee: Secondary | ICD-10-CM | POA: Diagnosis not present

## 2021-09-13 DIAGNOSIS — L659 Nonscarring hair loss, unspecified: Secondary | ICD-10-CM | POA: Diagnosis not present

## 2021-09-25 DIAGNOSIS — E039 Hypothyroidism, unspecified: Secondary | ICD-10-CM | POA: Diagnosis not present

## 2021-09-25 DIAGNOSIS — Z6838 Body mass index (BMI) 38.0-38.9, adult: Secondary | ICD-10-CM | POA: Diagnosis not present

## 2021-09-25 DIAGNOSIS — Z79899 Other long term (current) drug therapy: Secondary | ICD-10-CM | POA: Diagnosis not present

## 2021-09-25 DIAGNOSIS — I10 Essential (primary) hypertension: Secondary | ICD-10-CM | POA: Diagnosis not present

## 2021-11-04 DIAGNOSIS — Z20822 Contact with and (suspected) exposure to covid-19: Secondary | ICD-10-CM | POA: Diagnosis not present

## 2021-11-04 DIAGNOSIS — J069 Acute upper respiratory infection, unspecified: Secondary | ICD-10-CM | POA: Diagnosis not present

## 2021-11-04 DIAGNOSIS — Z885 Allergy status to narcotic agent status: Secondary | ICD-10-CM | POA: Diagnosis not present

## 2022-01-31 ENCOUNTER — Encounter: Payer: Self-pay | Admitting: *Deleted

## 2022-01-31 ENCOUNTER — Telehealth: Payer: Self-pay | Admitting: *Deleted

## 2022-01-31 NOTE — Patient Outreach (Signed)
  Care Coordination   Initial Visit Note   01/31/2022 Name: Margaret Hancock MRN: [id number] DOB: September 28, 1955  Margaret Hancock is a 67 y.o. year old female who sees Maurice Small, MD for primary care. I spoke with  Miguel Dibble by phone today.  What matters to the patients health and wellness today?  No needs    Goals Addressed             This Visit's Progress    COMPLETED: care coordination activity       Care Coordination Interventions: Reviewed medications with patient and discussed adherence with no needed refills Reviewed scheduled/upcoming provider appointments including sufficient transportation source Screening for signs and symptoms of depression related to chronic disease state  Assessed social determinant of health barriers Educated on care management services with no needed refills         SDOH assessments and interventions completed:  Yes  SDOH Interventions Today    Flowsheet Row Most Recent Value  SDOH Interventions   Food Insecurity Interventions Intervention Not Indicated  Housing Interventions Intervention Not Indicated  Transportation Interventions Intervention Not Indicated  Utilities Interventions Intervention Not Indicated        Care Coordination Interventions:  Yes, provided   Follow up plan: No further intervention required.   Encounter Outcome:  Pt. Visit Completed   Raina Mina, RN Care Management Coordinator [place] Office [phone]

## 2022-01-31 NOTE — Patient Instructions (Signed)
Visit Information  Thank you for taking time to visit with me today. Please don't hesitate to contact me if I can be of assistance to you.   Following are the goals we discussed today:   Goals Addressed             This Visit's Progress    COMPLETED: care coordination activity       Care Coordination Interventions: Reviewed medications with patient and discussed adherence with no needed refills Reviewed scheduled/upcoming provider appointments including sufficient transportation source Screening for signs and symptoms of depression related to chronic disease state  Assessed social determinant of health barriers Educated on care management services with no needed refills         Please call the care guide team at [phone] if you need to cancel or reschedule your appointment.   If you are experiencing a Mental Health or [place] or need someone to talk to, please call the Suicide and Crisis Lifeline: 988  Patient verbalizes understanding of instructions and care plan provided today and agrees to view in [place]. Active MyChart status and patient understanding of how to access instructions and care plan via MyChart confirmed with patient.     No further follow up required: No needs  Raina Mina, RN Care Management Coordinator [place] Office [phone]

## 2022-03-01 DIAGNOSIS — J069 Acute upper respiratory infection, unspecified: Secondary | ICD-10-CM | POA: Diagnosis not present

## 2022-03-01 DIAGNOSIS — Z20822 Contact with and (suspected) exposure to covid-19: Secondary | ICD-10-CM | POA: Diagnosis not present

## 2022-03-26 DIAGNOSIS — E785 Hyperlipidemia, unspecified: Secondary | ICD-10-CM | POA: Diagnosis not present

## 2022-03-26 DIAGNOSIS — R739 Hyperglycemia, unspecified: Secondary | ICD-10-CM | POA: Diagnosis not present

## 2022-03-26 DIAGNOSIS — I10 Essential (primary) hypertension: Secondary | ICD-10-CM | POA: Diagnosis not present

## 2022-03-26 DIAGNOSIS — G4733 Obstructive sleep apnea (adult) (pediatric): Secondary | ICD-10-CM | POA: Diagnosis not present

## 2022-03-26 DIAGNOSIS — Z1159 Encounter for screening for other viral diseases: Secondary | ICD-10-CM | POA: Diagnosis not present

## 2022-03-26 DIAGNOSIS — M1990 Unspecified osteoarthritis, unspecified site: Secondary | ICD-10-CM | POA: Diagnosis not present

## 2022-03-26 DIAGNOSIS — Z0000 Encounter for general adult medical examination without abnormal findings: Secondary | ICD-10-CM | POA: Diagnosis not present

## 2022-03-26 DIAGNOSIS — E039 Hypothyroidism, unspecified: Secondary | ICD-10-CM | POA: Diagnosis not present

## 2022-03-27 ENCOUNTER — Other Ambulatory Visit (HOSPITAL_BASED_OUTPATIENT_CLINIC_OR_DEPARTMENT_OTHER): Payer: Self-pay | Admitting: Family Medicine

## 2022-03-27 DIAGNOSIS — E785 Hyperlipidemia, unspecified: Secondary | ICD-10-CM

## 2022-04-05 ENCOUNTER — Ambulatory Visit (HOSPITAL_BASED_OUTPATIENT_CLINIC_OR_DEPARTMENT_OTHER)
Admission: RE | Admit: 2022-04-05 | Discharge: 2022-04-05 | Disposition: A | Discharge status: Home / Self Care | Payer: Medicare PPO | Source: Ambulatory Visit | Attending: Family Medicine | Admitting: Family Medicine

## 2022-04-05 DIAGNOSIS — E785 Hyperlipidemia, unspecified: Secondary | ICD-10-CM | POA: Insufficient documentation

## 2022-04-10 DIAGNOSIS — D692 Other nonthrombocytopenic purpura: Secondary | ICD-10-CM | POA: Diagnosis not present

## 2022-04-10 DIAGNOSIS — Z411 Encounter for cosmetic surgery: Secondary | ICD-10-CM | POA: Diagnosis not present

## 2022-04-10 DIAGNOSIS — L821 Other seborrheic keratosis: Secondary | ICD-10-CM | POA: Diagnosis not present

## 2022-04-10 DIAGNOSIS — L578 Other skin changes due to chronic exposure to nonionizing radiation: Secondary | ICD-10-CM | POA: Diagnosis not present

## 2022-04-10 DIAGNOSIS — D225 Melanocytic nevi of trunk: Secondary | ICD-10-CM | POA: Diagnosis not present

## 2022-04-10 DIAGNOSIS — L814 Other melanin hyperpigmentation: Secondary | ICD-10-CM | POA: Diagnosis not present

## 2022-04-10 DIAGNOSIS — Z808 Family history of malignant neoplasm of other organs or systems: Secondary | ICD-10-CM | POA: Diagnosis not present

## 2022-04-17 ENCOUNTER — Other Ambulatory Visit (HOSPITAL_BASED_OUTPATIENT_CLINIC_OR_DEPARTMENT_OTHER): Payer: Self-pay

## 2022-04-17 DIAGNOSIS — R911 Solitary pulmonary nodule: Secondary | ICD-10-CM | POA: Diagnosis not present

## 2022-04-17 DIAGNOSIS — R931 Abnormal findings on diagnostic imaging of heart and coronary circulation: Secondary | ICD-10-CM | POA: Diagnosis not present

## 2022-04-17 MED ORDER — MOUNJARO 15 MG/0.5ML ~~LOC~~ SOAJ
15.0000 mg | SUBCUTANEOUS | 3 refills | Status: DC
  Filled 2022-04-17: qty 2, 28d supply, fill #0
  Filled 2022-05-29: qty 2, 28d supply, fill #1
  Filled 2022-07-12: qty 2, 28d supply, fill #2
  Filled 2022-08-13: qty 2, 28d supply, fill #3
  Filled 2022-09-06: qty 2, 28d supply, fill #4
  Filled 2022-10-18: qty 2, 28d supply, fill #5
  Filled 2022-11-10: qty 2, 28d supply, fill #6
  Filled 2022-12-08 – 2022-12-09 (×2): qty 2, 28d supply, fill #7

## 2022-04-18 ENCOUNTER — Other Ambulatory Visit (HOSPITAL_BASED_OUTPATIENT_CLINIC_OR_DEPARTMENT_OTHER): Payer: Self-pay

## 2022-04-18 ENCOUNTER — Other Ambulatory Visit: Payer: Self-pay | Admitting: Family Medicine

## 2022-04-18 DIAGNOSIS — R911 Solitary pulmonary nodule: Secondary | ICD-10-CM

## 2022-04-19 ENCOUNTER — Other Ambulatory Visit (HOSPITAL_BASED_OUTPATIENT_CLINIC_OR_DEPARTMENT_OTHER): Payer: Self-pay

## 2022-04-29 ENCOUNTER — Other Ambulatory Visit (HOSPITAL_BASED_OUTPATIENT_CLINIC_OR_DEPARTMENT_OTHER): Payer: Self-pay

## 2022-04-30 ENCOUNTER — Other Ambulatory Visit (HOSPITAL_BASED_OUTPATIENT_CLINIC_OR_DEPARTMENT_OTHER): Payer: Self-pay

## 2022-05-06 ENCOUNTER — Other Ambulatory Visit (HOSPITAL_BASED_OUTPATIENT_CLINIC_OR_DEPARTMENT_OTHER): Payer: Self-pay

## 2022-05-20 ENCOUNTER — Other Ambulatory Visit: Payer: Medicare PPO

## 2022-05-30 ENCOUNTER — Other Ambulatory Visit (HOSPITAL_BASED_OUTPATIENT_CLINIC_OR_DEPARTMENT_OTHER): Payer: Self-pay

## 2022-06-04 ENCOUNTER — Ambulatory Visit
Admission: RE | Admit: 2022-06-04 | Discharge: 2022-06-04 | Disposition: A | Discharge status: Home / Self Care | Payer: Medicare PPO | Source: Ambulatory Visit | Attending: Family Medicine | Admitting: Family Medicine

## 2022-06-04 DIAGNOSIS — I700 Atherosclerosis of aorta: Secondary | ICD-10-CM | POA: Diagnosis not present

## 2022-06-04 DIAGNOSIS — R911 Solitary pulmonary nodule: Secondary | ICD-10-CM | POA: Diagnosis not present

## 2022-06-06 ENCOUNTER — Other Ambulatory Visit (HOSPITAL_BASED_OUTPATIENT_CLINIC_OR_DEPARTMENT_OTHER): Payer: Self-pay

## 2022-06-11 DIAGNOSIS — R931 Abnormal findings on diagnostic imaging of heart and coronary circulation: Secondary | ICD-10-CM | POA: Insufficient documentation

## 2022-06-11 DIAGNOSIS — R911 Solitary pulmonary nodule: Secondary | ICD-10-CM | POA: Insufficient documentation

## 2022-06-11 NOTE — Progress Notes (Unsigned)
Cardiology Office Note   Date:  06/12/2022   ID:  Margaret Hancock, DOB 31-Jul-1955, MRN [id number]  PCP:  Shirlean Mylar, MD  Cardiologist:   None Referring:  Shirlean Mylar, MD  Chief Complaint  Patient presents with   Elevated coronary calcium      History of Present Illness: Margaret Hancock is a 67 y.o. female who presents for evaluation of an elevated coronary calcium score.  This was 174 which was 57th percentile.I saw her in 2014.    She had tachycardia.  She otherwise has not had any prior cardiac history.  The patient denies any new symptoms such as chest discomfort, neck or arm discomfort. There has been no new shortness of breath, PND or orthopnea. There have been no reported palpitations, presyncope or syncope.   She is somewhat limited in her activities because of some back and hip pain.  She does her household chores including climbing stairs.  She goes grocery shopping.  The patient denies any new symptoms such as chest discomfort, neck or arm discomfort. There has been no new shortness of breath, PND or orthopnea. There have been no reported palpitations, presyncope or syncope.    Past Medical History:  Diagnosis Date   Hyperlipidemia    Hypertension    OA (osteoarthritis)    Obesity    Osteoarthritis    Thyroid disease     Past Surgical History:  Procedure Laterality Date   CESAREAN SECTION     EYE SURGERY     Lasik -bilateral   FOOT SURGERY     LUMBAR FUSION     x 2   TOTAL HIP ARTHROPLASTY Left 08/16/2014   Procedure: LEFT TOTAL HIP ARTHROPLASTY ANTERIOR APPROACH;  Surgeon: Durene Romans, MD;  Location: [facility];  Service: Orthopedics;  Laterality: Left;     Current Outpatient Medications  Medication Sig Dispense Refill   amLODipine (NORVASC) 10 MG tablet Take 10 mg by mouth at bedtime.      Azelastine HCl 137 MCG/SPRAY SOLN as needed.     CALCIUM-VITAMIN D PO Take 1 tablet by mouth daily.      DIPHENHYDRAMINE HCL, SLEEP, PO Take 50 mg by mouth at bedtime.       esomeprazole (NEXIUM) 20 MG capsule Take 20 mg by mouth daily.     hydrochlorothiazide (HYDRODIURIL) 25 MG tablet Take 25 mg by mouth every morning.      levothyroxine (SYNTHROID, LEVOTHROID) 125 MCG tablet Take 125 mcg by mouth daily before breakfast.     lisinopril (PRINIVIL,ZESTRIL) 10 MG tablet Take 10 mg by mouth at bedtime.      Multiple Vitamin (MULTIVITAMIN) tablet Take 1 tablet by mouth daily.     rosuvastatin (CRESTOR) 20 MG tablet Take 1 tablet (20 mg total) by mouth daily. 90 tablet 3   tirzepatide (MOUNJARO) 15 MG/0.5ML Pen Inject 15 mg into the skin once a week. 6 mL 3   vitamin E 400 UNIT capsule Take 400 Units by mouth daily.     docusate sodium (COLACE) 100 MG capsule Take 1 capsule (100 mg total) by mouth 2 (two) times daily. (Patient not taking: Reported on 06/12/2022) 10 capsule 0   ferrous sulfate 325 (65 FE) MG tablet Take 1 tablet (325 mg total) by mouth 3 (three) times daily after meals. (Patient not taking: Reported on 06/12/2022)  3   HYDROcodone-acetaminophen (NORCO) 7.5-325 MG per tablet Take 1-2 tablets by mouth every 4 (four) hours as needed for moderate pain. (Patient  not taking: Reported on 06/12/2022) 100 tablet 0   methocarbamol (ROBAXIN) 500 MG tablet Take 1 tablet (500 mg total) by mouth every 6 (six) hours as needed for muscle spasms. (Patient not taking: Reported on 06/12/2022) 50 tablet 0   omeprazole (PRILOSEC) 20 MG capsule Take 20 mg by mouth at bedtime.  (Patient not taking: Reported on 06/12/2022)     polyethylene glycol (MIRALAX / GLYCOLAX) packet Take 17 g by mouth 2 (two) times daily. (Patient not taking: Reported on 06/12/2022) 14 each 0   tirzepatide [facility]) 10 MG/0.5ML Pen Inject 0.5 ml under the skin once a week on Sunday mornings (Patient not taking: Reported on 06/12/2022) 6 mL 1   No current facility-administered medications for this visit.    Allergies:   Oxycodone and Nitrous oxide    Social History:  The patient  reports that she has never  smoked. She does not have any smokeless tobacco history on file. She reports current alcohol use. She reports that she does not use drugs.   Family History:  The patient's family history includes CAD (age of onset: 47) in her brother; Diabetes (age of onset: 69) in her sister; Hypertension in her father; Stroke in her mother; Stroke (age of onset: 39) in her father.    ROS:  Please see the history of present illness.   Otherwise, review of systems are positive for none.   All other systems are reviewed and negative.    PHYSICAL EXAM: VS:  BP 122/68 (BP Location: Left Arm, Patient Position: Sitting, Cuff Size: Large)   Pulse 80   Ht 5\' 2"  (1.575 m)   Wt 193 lb 3.2 oz (87.6 kg)   SpO2 95%   BMI 35.34 kg/m  , BMI Body mass index is 35.34 kg/m. GENERAL:  Well appearing HEENT:  Pupils equal round and reactive, fundi not visualized, oral mucosa unremarkable NECK:  No jugular venous distention, waveform within normal limits, carotid upstroke brisk and symmetric, no bruits, no thyromegaly LYMPHATICS:  No cervical, inguinal adenopathy LUNGS:  Clear to auscultation bilaterally BACK:  No CVA tenderness CHEST:  Unremarkable HEART:  PMI not displaced or sustained,S1 and S2 within normal limits, no S3, no S4, no clicks, no rubs, no murmurs ABD:  Flat, positive bowel sounds normal in frequency in pitch, no bruits, no rebound, no guarding, no midline pulsatile mass, no hepatomegaly, no splenomegaly EXT:  2 plus pulses throughout, no edema, no cyanosis no clubbing SKIN:  No rashes no nodules NEURO:  Cranial nerves II through XII grossly intact, motor grossly intact throughout PSYCH:  Cognitively intact, oriented to person place and time    EKG:  EKG is ordered today. The ekg ordered today demonstrates sinus rhythm, rate 80, axis within normal limits, intervals within normal limits, no acute ST-T wave changes.   Recent Labs: No results found for requested labs within last 365 days.    Lipid  Panel No results found for: "CHOL", "TRIG", "HDL", "CHOLHDL", "VLDL", "LDLCALC", "LDLDIRECT"    Wt Readings from Last 3 Encounters:  06/12/22 193 lb 3.2 oz (87.6 kg)  08/16/14 207 lb (93.9 kg)  08/10/14 207 lb (93.9 kg)      Other studies Reviewed: Additional studies/ records that were reviewed today include: Labs, CTs. Review of the above records demonstrates:  Please see elsewhere in the note.     ASSESSMENT AND PLAN:  Elevated coronary calcium: She has no symptoms related to this.  No further testing is indicated but she needs aggressive  primary risk reduction.  We had a long conversation about this.  We talked at length about exercise.  We talked about diet.  Dyslipidemia:  LDL was slightly above target.  I would prefer that she not be on 80 mg of simvastatin and we will switch her to rosuvastatin 20 mg daily and repeat a lipid profile and LP(a) in about 3 months  Current medicines are reviewed at length with the patient today.  The patient does not have concerns regarding medicines.  The following changes have been made:  no change  Labs/ tests ordered today include: None  Orders Placed This Encounter  Procedures   Lipid panel   Lipoprotein A (LPA)   EKG 12-Lead     Disposition:   FU with me as needed.      Signed, Rollene Rotunda, MD  06/12/2022 3:59 PM    [facility]

## 2022-06-12 ENCOUNTER — Encounter: Payer: Self-pay | Admitting: Cardiology

## 2022-06-12 ENCOUNTER — Ambulatory Visit: Payer: Medicare PPO | Attending: Cardiology | Admitting: Cardiology

## 2022-06-12 VITALS — BP 122/68 | HR 80 | Ht 62.0 in | Wt 193.2 lb

## 2022-06-12 DIAGNOSIS — R911 Solitary pulmonary nodule: Secondary | ICD-10-CM | POA: Diagnosis not present

## 2022-06-12 DIAGNOSIS — R931 Abnormal findings on diagnostic imaging of heart and coronary circulation: Secondary | ICD-10-CM

## 2022-06-12 DIAGNOSIS — E785 Hyperlipidemia, unspecified: Secondary | ICD-10-CM | POA: Diagnosis not present

## 2022-06-12 MED ORDER — ROSUVASTATIN CALCIUM 20 MG PO TABS: 20.0000 mg | ORAL_TABLET | Freq: Every day | ORAL | 3 refills | Status: DC

## 2022-06-12 NOTE — Patient Instructions (Signed)
Medication Instructions:  STOP simvastatin   START rosuvastatin (crestor) 20mg  daily   *If you need a refill on your cardiac medications before your next appointment, please call your pharmacy*   Lab Work: FASTING lab work to check cholesterol in 3-4 months   If you have labs (blood work) drawn today and your tests are completely normal, you will receive your results only by: MyChart Message (if you have MyChart) OR A paper copy in the mail If you have any lab test that is abnormal or we need to change your treatment, we will call you to review the results.    Follow-Up: At [facility] you and your health needs are our priority.  As part of our continuing mission to provide you with exceptional heart care, we have created designated Provider Care Teams.  These Care Teams include your primary Cardiologist (physician) and Advanced Practice Providers (APPs -  Physician Assistants and Nurse Practitioners) who all work together to provide you with the care you need, when you need it.  We recommend signing up for the patient portal called "MyChart".  Sign up information is provided on this After Visit Summary.  MyChart is used to connect with patients for Virtual Visits (Telemedicine).  Patients are able to view lab/test results, encounter notes, upcoming appointments, etc.  Non-urgent messages can be sent to your provider as well.   To learn more about what you can do with MyChart, go to [email or web address].    Your next appointment:   AS NEEDED with Dr. Antoine Poche

## 2022-06-18 DIAGNOSIS — N390 Urinary tract infection, site not specified: Secondary | ICD-10-CM | POA: Diagnosis not present

## 2022-06-18 DIAGNOSIS — R300 Dysuria: Secondary | ICD-10-CM | POA: Diagnosis not present

## 2022-06-21 ENCOUNTER — Other Ambulatory Visit (HOSPITAL_BASED_OUTPATIENT_CLINIC_OR_DEPARTMENT_OTHER): Payer: Self-pay | Admitting: Family Medicine

## 2022-06-21 DIAGNOSIS — Z1231 Encounter for screening mammogram for malignant neoplasm of breast: Secondary | ICD-10-CM

## 2022-07-12 ENCOUNTER — Other Ambulatory Visit (HOSPITAL_BASED_OUTPATIENT_CLINIC_OR_DEPARTMENT_OTHER): Payer: Self-pay

## 2022-07-15 ENCOUNTER — Other Ambulatory Visit (HOSPITAL_BASED_OUTPATIENT_CLINIC_OR_DEPARTMENT_OTHER): Payer: Self-pay

## 2022-08-01 DIAGNOSIS — M25552 Pain in left hip: Secondary | ICD-10-CM | POA: Diagnosis not present

## 2022-08-01 DIAGNOSIS — M6281 Muscle weakness (generalized): Secondary | ICD-10-CM | POA: Diagnosis not present

## 2022-08-01 DIAGNOSIS — R293 Abnormal posture: Secondary | ICD-10-CM | POA: Diagnosis not present

## 2022-08-01 DIAGNOSIS — Z7409 Other reduced mobility: Secondary | ICD-10-CM | POA: Diagnosis not present

## 2022-08-05 ENCOUNTER — Ambulatory Visit (HOSPITAL_BASED_OUTPATIENT_CLINIC_OR_DEPARTMENT_OTHER)
Admission: RE | Admit: 2022-08-05 | Discharge: 2022-08-05 | Disposition: A | Discharge status: Home / Self Care | Payer: Medicare PPO | Source: Ambulatory Visit | Attending: Family Medicine | Admitting: Family Medicine

## 2022-08-05 ENCOUNTER — Encounter (HOSPITAL_BASED_OUTPATIENT_CLINIC_OR_DEPARTMENT_OTHER): Payer: Self-pay

## 2022-08-05 DIAGNOSIS — Z1231 Encounter for screening mammogram for malignant neoplasm of breast: Secondary | ICD-10-CM | POA: Insufficient documentation

## 2022-08-08 ENCOUNTER — Other Ambulatory Visit: Payer: Self-pay | Admitting: Family Medicine

## 2022-08-08 DIAGNOSIS — M25552 Pain in left hip: Secondary | ICD-10-CM | POA: Diagnosis not present

## 2022-08-08 DIAGNOSIS — Z7409 Other reduced mobility: Secondary | ICD-10-CM | POA: Diagnosis not present

## 2022-08-08 DIAGNOSIS — R928 Other abnormal and inconclusive findings on diagnostic imaging of breast: Secondary | ICD-10-CM

## 2022-08-08 DIAGNOSIS — M6281 Muscle weakness (generalized): Secondary | ICD-10-CM | POA: Diagnosis not present

## 2022-08-08 DIAGNOSIS — R293 Abnormal posture: Secondary | ICD-10-CM | POA: Diagnosis not present

## 2022-08-15 ENCOUNTER — Other Ambulatory Visit (HOSPITAL_BASED_OUTPATIENT_CLINIC_OR_DEPARTMENT_OTHER): Payer: Self-pay

## 2022-08-15 ENCOUNTER — Ambulatory Visit: Payer: Medicare PPO

## 2022-08-15 ENCOUNTER — Ambulatory Visit
Admission: RE | Admit: 2022-08-15 | Discharge: 2022-08-15 | Disposition: A | Discharge status: Home / Self Care | Payer: Medicare PPO | Source: Ambulatory Visit | Attending: Family Medicine | Admitting: Family Medicine

## 2022-08-15 DIAGNOSIS — M25552 Pain in left hip: Secondary | ICD-10-CM | POA: Diagnosis not present

## 2022-08-15 DIAGNOSIS — R928 Other abnormal and inconclusive findings on diagnostic imaging of breast: Secondary | ICD-10-CM

## 2022-08-15 DIAGNOSIS — N6459 Other signs and symptoms in breast: Secondary | ICD-10-CM | POA: Diagnosis not present

## 2022-08-15 DIAGNOSIS — Z7409 Other reduced mobility: Secondary | ICD-10-CM | POA: Diagnosis not present

## 2022-08-15 DIAGNOSIS — Z789 Other specified health status: Secondary | ICD-10-CM | POA: Diagnosis not present

## 2022-08-15 DIAGNOSIS — M6281 Muscle weakness (generalized): Secondary | ICD-10-CM | POA: Diagnosis not present

## 2022-08-15 DIAGNOSIS — R293 Abnormal posture: Secondary | ICD-10-CM | POA: Diagnosis not present

## 2022-08-16 DIAGNOSIS — M1711 Unilateral primary osteoarthritis, right knee: Secondary | ICD-10-CM | POA: Diagnosis not present

## 2022-08-19 ENCOUNTER — Other Ambulatory Visit: Payer: Medicare PPO

## 2022-08-27 DIAGNOSIS — U071 COVID-19: Secondary | ICD-10-CM | POA: Diagnosis not present

## 2022-09-04 DIAGNOSIS — R293 Abnormal posture: Secondary | ICD-10-CM | POA: Diagnosis not present

## 2022-09-04 DIAGNOSIS — Z789 Other specified health status: Secondary | ICD-10-CM | POA: Diagnosis not present

## 2022-09-04 DIAGNOSIS — Z7409 Other reduced mobility: Secondary | ICD-10-CM | POA: Diagnosis not present

## 2022-09-04 DIAGNOSIS — M6281 Muscle weakness (generalized): Secondary | ICD-10-CM | POA: Diagnosis not present

## 2022-09-04 DIAGNOSIS — M25552 Pain in left hip: Secondary | ICD-10-CM | POA: Diagnosis not present

## 2022-09-12 DIAGNOSIS — M6281 Muscle weakness (generalized): Secondary | ICD-10-CM | POA: Diagnosis not present

## 2022-09-12 DIAGNOSIS — M1711 Unilateral primary osteoarthritis, right knee: Secondary | ICD-10-CM | POA: Diagnosis not present

## 2022-09-12 DIAGNOSIS — R293 Abnormal posture: Secondary | ICD-10-CM | POA: Diagnosis not present

## 2022-09-12 DIAGNOSIS — M25552 Pain in left hip: Secondary | ICD-10-CM | POA: Diagnosis not present

## 2022-09-12 DIAGNOSIS — Z7409 Other reduced mobility: Secondary | ICD-10-CM | POA: Diagnosis not present

## 2022-09-12 DIAGNOSIS — Z789 Other specified health status: Secondary | ICD-10-CM | POA: Diagnosis not present

## 2022-09-19 DIAGNOSIS — M1711 Unilateral primary osteoarthritis, right knee: Secondary | ICD-10-CM | POA: Diagnosis not present

## 2022-09-19 DIAGNOSIS — E785 Hyperlipidemia, unspecified: Secondary | ICD-10-CM | POA: Diagnosis not present

## 2022-09-20 ENCOUNTER — Encounter: Payer: Self-pay | Admitting: Cardiology

## 2022-09-25 DIAGNOSIS — M1711 Unilateral primary osteoarthritis, right knee: Secondary | ICD-10-CM | POA: Diagnosis not present

## 2022-09-26 DIAGNOSIS — Z981 Arthrodesis status: Secondary | ICD-10-CM | POA: Diagnosis not present

## 2022-09-26 DIAGNOSIS — M48062 Spinal stenosis, lumbar region with neurogenic claudication: Secondary | ICD-10-CM | POA: Diagnosis not present

## 2022-09-26 DIAGNOSIS — M5136 Other intervertebral disc degeneration, lumbar region: Secondary | ICD-10-CM | POA: Diagnosis not present

## 2022-09-26 DIAGNOSIS — Z6834 Body mass index (BMI) 34.0-34.9, adult: Secondary | ICD-10-CM | POA: Diagnosis not present

## 2022-09-30 ENCOUNTER — Other Ambulatory Visit (HOSPITAL_BASED_OUTPATIENT_CLINIC_OR_DEPARTMENT_OTHER): Payer: Self-pay

## 2022-10-03 ENCOUNTER — Other Ambulatory Visit (HOSPITAL_BASED_OUTPATIENT_CLINIC_OR_DEPARTMENT_OTHER): Payer: Self-pay | Admitting: Neurosurgery

## 2022-10-03 ENCOUNTER — Telehealth (HOSPITAL_BASED_OUTPATIENT_CLINIC_OR_DEPARTMENT_OTHER): Payer: Self-pay

## 2022-10-03 DIAGNOSIS — M5136 Other intervertebral disc degeneration, lumbar region: Secondary | ICD-10-CM

## 2022-10-03 DIAGNOSIS — M51369 Other intervertebral disc degeneration, lumbar region without mention of lumbar back pain or lower extremity pain: Secondary | ICD-10-CM

## 2022-10-20 ENCOUNTER — Ambulatory Visit (HOSPITAL_BASED_OUTPATIENT_CLINIC_OR_DEPARTMENT_OTHER)
Admission: RE | Admit: 2022-10-20 | Discharge: 2022-10-20 | Disposition: A | Discharge status: Home / Self Care | Payer: Medicare PPO | Source: Ambulatory Visit | Attending: Neurosurgery | Admitting: Neurosurgery

## 2022-10-20 DIAGNOSIS — Z981 Arthrodesis status: Secondary | ICD-10-CM | POA: Insufficient documentation

## 2022-10-20 DIAGNOSIS — M51379 Other intervertebral disc degeneration, lumbosacral region without mention of lumbar back pain or lower extremity pain: Secondary | ICD-10-CM | POA: Diagnosis not present

## 2022-10-20 DIAGNOSIS — M51369 Other intervertebral disc degeneration, lumbar region without mention of lumbar back pain or lower extremity pain: Secondary | ICD-10-CM | POA: Insufficient documentation

## 2022-10-20 DIAGNOSIS — M4807 Spinal stenosis, lumbosacral region: Secondary | ICD-10-CM | POA: Diagnosis not present

## 2022-10-20 DIAGNOSIS — M48061 Spinal stenosis, lumbar region without neurogenic claudication: Secondary | ICD-10-CM | POA: Diagnosis not present

## 2022-10-20 MED ORDER — GADOBUTROL 1 MMOL/ML IV SOLN
9.0000 mL | Freq: Once | INTRAVENOUS | Status: AC | PRN
  Administered 2022-10-20: 9 mL via INTRAVENOUS

## 2022-10-30 DIAGNOSIS — M5416 Radiculopathy, lumbar region: Secondary | ICD-10-CM | POA: Diagnosis not present

## 2022-10-30 DIAGNOSIS — Z6834 Body mass index (BMI) 34.0-34.9, adult: Secondary | ICD-10-CM | POA: Diagnosis not present

## 2022-11-07 DIAGNOSIS — M5416 Radiculopathy, lumbar region: Secondary | ICD-10-CM | POA: Diagnosis not present

## 2022-12-09 ENCOUNTER — Other Ambulatory Visit (HOSPITAL_BASED_OUTPATIENT_CLINIC_OR_DEPARTMENT_OTHER): Payer: Self-pay

## 2022-12-09 ENCOUNTER — Other Ambulatory Visit: Payer: Self-pay

## 2022-12-10 ENCOUNTER — Other Ambulatory Visit: Payer: Self-pay

## 2022-12-10 DIAGNOSIS — M5416 Radiculopathy, lumbar region: Secondary | ICD-10-CM | POA: Diagnosis not present

## 2022-12-26 DIAGNOSIS — M4310 Spondylolisthesis, site unspecified: Secondary | ICD-10-CM | POA: Diagnosis not present

## 2022-12-26 DIAGNOSIS — M5416 Radiculopathy, lumbar region: Secondary | ICD-10-CM | POA: Diagnosis not present

## 2023-01-02 ENCOUNTER — Other Ambulatory Visit: Payer: Self-pay | Admitting: Neurosurgery

## 2023-01-06 ENCOUNTER — Other Ambulatory Visit: Payer: Self-pay

## 2023-01-06 ENCOUNTER — Telehealth: Payer: Self-pay | Admitting: Cardiology

## 2023-01-06 ENCOUNTER — Other Ambulatory Visit (HOSPITAL_BASED_OUTPATIENT_CLINIC_OR_DEPARTMENT_OTHER): Payer: Self-pay

## 2023-01-06 DIAGNOSIS — M5416 Radiculopathy, lumbar region: Secondary | ICD-10-CM | POA: Insufficient documentation

## 2023-01-06 DIAGNOSIS — M48061 Spinal stenosis, lumbar region without neurogenic claudication: Secondary | ICD-10-CM | POA: Insufficient documentation

## 2023-01-06 DIAGNOSIS — M5386 Other specified dorsopathies, lumbar region: Secondary | ICD-10-CM | POA: Insufficient documentation

## 2023-01-06 DIAGNOSIS — M4310 Spondylolisthesis, site unspecified: Secondary | ICD-10-CM | POA: Insufficient documentation

## 2023-01-06 NOTE — Telephone Encounter (Signed)
Called and spoke to patient. Verified name and DOB. Patient calling about labs that were ordered at her 5/29 visit. I informed patient she had labs drawn on 9/5 and was called with the results.

## 2023-01-06 NOTE — Telephone Encounter (Signed)
Pt calling to f/u in lab orders tthat were to be put in at last visit on 06/12/22 for a Lipid Panel. Please advise

## 2023-01-07 ENCOUNTER — Other Ambulatory Visit (HOSPITAL_BASED_OUTPATIENT_CLINIC_OR_DEPARTMENT_OTHER): Payer: Self-pay

## 2023-01-07 MED ORDER — MOUNJARO 15 MG/0.5ML ~~LOC~~ SOAJ
15.0000 mg | SUBCUTANEOUS | 3 refills | Status: DC
  Filled 2023-01-07: qty 6, 84d supply, fill #0
  Filled 2023-03-28: qty 6, 84d supply, fill #1
  Filled 2023-06-19: qty 6, 84d supply, fill #2
  Filled 2023-09-10: qty 6, 84d supply, fill #3

## 2023-01-28 NOTE — Progress Notes (Signed)
Office Note     CC:  ALIF L5-S1 Dr. Jordan Likes Requesting Provider:  Camie Patience, FNP  HPI: Margaret Hancock is a 68 y.o. (1955/12/15) female presenting at the request of .Camie Patience, FNP for anterior exposure of the lumbar spine specifically at L5-S1 for interbody fusion.  On exam, Margaret Hancock was doing well.  A native of [place], she moved to [place] to attend [employer].  She subsequently stayed.  She is now retired, and is open to enjoy her retirement.  She has bilateral lower extremity pain and numbness which occurs in the hips after standing for roughly 10 minutes.  This is affecting her ambulation, and has severely limited her lifestyle.  She denies ischemic rest pain, tissue loss.  Margaret Hancock has a history of posterior implementation L3-L4-L5 in 2000 Abdominal history includes C-section.   Past Medical History:  Diagnosis Date   Hyperlipidemia    Hypertension    OA (osteoarthritis)    Obesity    Osteoarthritis    Thyroid disease     Past Surgical History:  Procedure Laterality Date   CESAREAN SECTION     EYE SURGERY     Lasik -bilateral   FOOT SURGERY     LUMBAR FUSION     x 2   TOTAL HIP ARTHROPLASTY Left 08/16/2014   Procedure: LEFT TOTAL HIP ARTHROPLASTY ANTERIOR APPROACH;  Surgeon: Durene Romans, MD;  Location: [facility];  Service: Orthopedics;  Laterality: Left;    Social History   Socioeconomic History   Marital status: Married    Spouse name: Not on file   Number of children: 3   Years of education: Not on file   Highest education level: Not on file  Occupational History   Not on file  Tobacco Use   Smoking status: Never   Smokeless tobacco: Not on file  Substance and Sexual Activity   Alcohol use: Yes    Comment: occassionally-wine   Drug use: No   Sexual activity: Yes  Other Topics Concern   Not on file  Social History Narrative   Lives at home with husband.    Social Drivers of Corporate investment banker Strain: Not on file  Food  Insecurity: No Food Insecurity (01/31/2022)   Hunger Vital Sign    Worried About Running Out of Food in the Last Year: Never true    Ran Out of Food in the Last Year: Never true  Transportation Needs: No Transportation Needs (01/31/2022)   PRAPARE - Administrator, Civil Service (Medical): No    Lack of Transportation (Non-Medical): No  Physical Activity: Not on file  Stress: Not on file  Social Connections: Not on file  Intimate Partner Violence: Not on file   Family History  Problem Relation Age of Onset   Stroke Father 43   Hypertension Father    Stroke Mother    CAD Brother 48   Diabetes Sister 45    Current Outpatient Medications  Medication Sig Dispense Refill   amLODipine (NORVASC) 10 MG tablet 1 tablet Orally Once a day for 90 days     amoxicillin-clavulanate (AUGMENTIN) 875-125 MG tablet      Apoaequorin (PREVAGEN) 10 MG CAPS 1 capsule Orally once daily     azelastine (ASTELIN) 0.1 % nasal spray 1 puff in each nostril Nasally as needed     B Complex-Biotin-FA (SUPER B-50 COMPLEX PO)      benzonatate (TESSALON) 200 MG capsule  Calcium Carb-Cholecalciferol 600-20 MG-MCG TABS 2 tablets with a meal Orally Once a day     Carboxymethylcellulose Sodium (EYE DROPS OP) Eye Drops     celecoxib (CELEBREX) 200 MG capsule 1 capsule with food Orally Once a day for 90 days as needed for pain     Clocortolone Pivalate (CLODERM) 0.1 % cream as directed Externally twice a day 1 week (sample given)     Coenzyme Q10 (CO Q 10) 100 MG CAPS 1 capsule with a meal Orally Once a day     Coenzyme Q10-levOCARNitine (CO Q-10 PLUS PO)      COLLAGEN MATRIX, BOVINE, EX      COVID-19 Test (ID NOW COVID-19 2.0 TEST) KIT TEST AS DIRECTED TODAY     Cranberry-Vitamin C-Vitamin E (CRANBERRY PLUS VITAMIN C) 4200-20-3 MG-MG-UNIT CAPS 1 Capsule Orally Once a day to prevent UTI     Cyanocobalamin (VITAMIN B12) 1000 MCG TBCR 1 tablet Orally Once a day     cycloSPORINE, PF, (CEQUA) 0.09 % SOLN  instill 1 drop into each eye twice daily     diphenhydrAMINE (BENADRYL) 25 mg capsule 1 capsule Orally at bedtime     docusate sodium (COLACE) 100 MG capsule Take 1 capsule (100 mg total) by mouth 2 (two) times daily. (Patient not taking: Reported on 06/12/2022) 10 capsule 0   Emollient (AMBI EVEN & CLEAR SPF 30 EX) apply topically to sun exposed areas Daily for 30     esomeprazole (NEXIUM) 20 MG capsule Take 20 mg by mouth daily.     Esomeprazole Magnesium 20 MG TBEC 1 tablet Orally Once a day for 90 days     ferrous sulfate 325 (65 FE) MG tablet Take 1 tablet (325 mg total) by mouth 3 (three) times daily after meals. (Patient not taking: Reported on 06/12/2022)  3   fosfomycin (MONUROL) 3 g PACK      hydrochlorothiazide (HYDRODIURIL) 25 MG tablet 1 tablet Orally Once a day for 90 days     HYDROcodone-acetaminophen (NORCO) 7.5-325 MG per tablet Take 1-2 tablets by mouth every 4 (four) hours as needed for moderate pain. (Patient not taking: Reported on 06/12/2022) 100 tablet 0   Insulin Pen Needle (PEN NEEDLES) 32G X 6 MM MISC as directed SQ once daily for 90 days     levothyroxine (SYNTHROID) 88 MCG tablet Take 88 mcg by mouth daily.     levothyroxine (SYNTHROID, LEVOTHROID) 125 MCG tablet Take 125 mcg by mouth daily before breakfast.     lisinopril (ZESTRIL) 10 MG tablet 1 tablet Orally Once a day for 90 days     methocarbamol (ROBAXIN) 500 MG tablet Take 1 tablet (500 mg total) by mouth every 6 (six) hours as needed for muscle spasms. (Patient not taking: Reported on 06/12/2022) 50 tablet 0   Misc Natural Products (OSTEO BI-FLEX TRIPLE STRENGTH) TABS 1 tablet Orally once a day     Multiple Vitamin (MULTIVITAMIN ADULT PO)      nirmatrelvir/ritonavir (PAXLOVID, 300/100,) 20 x 150 MG & 10 x 100MG  TBPK      Omega 3-6-9 Fatty Acids (OMEGA 3-6-9 COMPLEX) CAPS 1 capsule Orally once a day     omeprazole (PRILOSEC) 20 MG capsule Take 20 mg by mouth at bedtime.  (Patient not taking: Reported on 06/12/2022)      polyethylene glycol (MIRALAX / GLYCOLAX) packet Take 17 g by mouth 2 (two) times daily. (Patient not taking: Reported on 06/12/2022) 14 each 0   promethazine-dextromethorphan (PROMETHAZINE-DM) 6.25-15 MG/5ML syrup  rosuvastatin (CRESTOR) 20 MG tablet      Simvastatin 20 MG/5ML SUSP Simvastatin     Specialty Vitamins Products (COLLAGEN ULTRA) CAPS 1 capsule Orally once a day     tirzepatide (MOUNJARO) 15 MG/0.5ML Pen INJECT 15 MG Subcutaneous once a week for 90 days     tirzepatide (MOUNJARO) 15 MG/0.5ML Pen Inject 15 mg into the skin once a week. 6 mL 3   tretinoin (RETIN-A) 0.025 % cream 1 application in the evening to face Externally Once a day for 30 days     triamcinolone cream (KENALOG) 0.1 % 1 application Externally Twice a day for 14 days     vitamin E 400 UNIT capsule Take 400 Units by mouth daily.     No current facility-administered medications for this visit.    Allergies  Allergen Reactions   Oxycodone Other (See Comments)    Patient states it makes her mean   Nitrous Oxide Nausea Only and Other (See Comments)     REVIEW OF SYSTEMS:  [X]  denotes positive finding, [ ]  denotes negative finding Cardiac  Comments:  Chest pain or chest pressure:    Shortness of breath upon exertion:    Short of breath when lying flat:    Irregular heart rhythm:        Vascular    Pain in calf, thigh, or hip brought on by ambulation:    Pain in feet at night that wakes you up from your sleep:     Blood clot in your veins:    Leg swelling:         Pulmonary    Oxygen at home:    Productive cough:     Wheezing:         Neurologic    Sudden weakness in arms or legs:     Sudden numbness in arms or legs:     Sudden onset of difficulty speaking or slurred speech:    Temporary loss of vision in one eye:     Problems with dizziness:         Gastrointestinal    Blood in stool:     Vomited blood:         Genitourinary    Burning when urinating:     Blood in urine:         Psychiatric    Major depression:         Hematologic    Bleeding problems:    Problems with blood clotting too easily:        Skin    Rashes or ulcers:        Constitutional    Fever or chills:      PHYSICAL EXAMINATION:  There were no vitals filed for this visit.  General:  WDWN in NAD; vital signs documented above Gait: Not observed HENT: WNL, normocephalic Pulmonary: normal non-labored breathing , without wheezing Cardiac: regular HR Abdomen: soft, NT, no masses Skin: Yeast rash in the pannus. Vascular Exam/Pulses:  Right Left  Radial 2+ (normal) 2+ (normal)  Ulnar    Femoral    Popliteal    DP 2+ (normal) 2+ (normal)  PT     Extremities: without ischemic changes, without Gangrene , without cellulitis; without open wounds;  Musculoskeletal: no muscle wasting or atrophy  Neurologic: A&O X 3;  No focal weakness or paresthesias are detected Psychiatric:  The pt has Normal affect.   Non-Invasive Vascular Imaging:   See MRI    ASSESSMENT/PLAN: Margaret Hancock  is a 68 y.o. female presenting prior to anterior lumbar interbody fusion to discuss the anterior approach.  I had a long discussion with Margaret Hancock regarding surgery, as well as the risks and benefits of my portion, which will be the safe exposure of the L5-S1 disc space.  She is at slightly higher risk than normal due to the prior posterior fusion.  On exam, she had a small amount of erythema appreciated in the groin.  I have prescribed her miconazole powder, which I asked her to use twice daily prior to surgery.  Margaret Hancock is aware that the safe exposure of the disc space will be done with the help of my partner, Dr. Clotilde Dieter as an assistant.  After discussing the risks and benefits of the above, Margaret Hancock elected to proceed.   Victorino Sparrow, MD [facility] [phone] Total time of patient care including pre-visit research, consultation, and documentation greater than 30 minutes

## 2023-01-30 ENCOUNTER — Ambulatory Visit: Payer: Medicare PPO | Admitting: Vascular Surgery

## 2023-01-30 ENCOUNTER — Encounter: Payer: Self-pay | Admitting: Vascular Surgery

## 2023-01-30 VITALS — BP 128/81 | HR 74 | Temp 98.1°F | Resp 20 | Ht 62.0 in | Wt 184.0 lb

## 2023-01-30 DIAGNOSIS — M5410 Radiculopathy, site unspecified: Secondary | ICD-10-CM | POA: Diagnosis not present

## 2023-01-30 MED ORDER — MICONAZOLE NITRATE POWD: Freq: Three times a day (TID) | Status: AC

## 2023-01-31 ENCOUNTER — Other Ambulatory Visit: Payer: Self-pay

## 2023-02-07 NOTE — Progress Notes (Signed)
Surgical Instructions   Your procedure is scheduled on Wednesday, February 14, 2023. Report to [facility] Main Entrance "A" at 5:30 A.M., then check in with the Admitting office. Any questions or running late day of surgery: call [phone]  Questions prior to your surgery date: call [phone], Monday-Friday, 8am-4pm. If you experience any cold or flu symptoms such as cough, fever, chills, shortness of breath, etc. between now and your scheduled surgery, please notify us at the above number.     Remember:  Do not eat after midnight the night before your surgery  You may drink clear liquids until 4:30 the morning of your surgery.   Clear liquids allowed are: Water, Non-Citrus Juices (without pulp), Carbonated Beverages, Clear Tea (no milk, honey, etc.), Black Coffee Only (NO MILK, CREAM OR POWDERED CREAMER of any kind), and Gatorade.    Take these medicines the morning of surgery with A SIP OF WATER  amLODipine (NORVASC)  Apoaequorin (PREVAGEN)  celecoxib (CELEBREX)  levothyroxine (SYNTHROID)   May take these medicines IF NEEDED: acetaminophen (TYLENOL)    One week prior to surgery, STOP taking any Aspirin (unless otherwise instructed by your surgeon) Aleve, Naproxen, Ibuprofen, Motrin, Advil, Goody's, BC's, all herbal medications, fish oil, and non-prescription vitamins.                     WHAT DO I DO ABOUT MY DIABETES MEDICATION?   Hold your tirzepatide [facility]) for 7 days prior to your surgery. Your last dose of tirzepatide [facility]) should be on Friday, 02/07/23.   HOW TO MANAGE YOUR DIABETES BEFORE AND AFTER SURGERY  Why is it important to control my blood sugar before and after surgery? Improving blood sugar levels before and after surgery helps healing and can limit problems. A way of improving blood sugar control is eating a healthy diet by:  Eating less sugar and carbohydrates  Increasing activity/exercise  Talking with your doctor about reaching your  blood sugar goals High blood sugars (greater than 180 mg/dL) can raise your risk of infections and slow your recovery, so you will need to focus on controlling your diabetes during the weeks before surgery. Make sure that the doctor who takes care of your diabetes knows about your planned surgery including the date and location.  How do I manage my blood sugar before surgery? Check your blood sugar at least 4 times a day, starting 2 days before surgery, to make sure that the level is not too high or low.  Check your blood sugar the morning of your surgery when you wake up and every 2 hours until you get to the Short Stay unit.  If your blood sugar is less than 70 mg/dL, you will need to treat for low blood sugar: Do not take insulin. Treat a low blood sugar (less than 70 mg/dL) with  cup of clear juice (cranberry or apple), 4 glucose tablets, OR glucose gel. Recheck blood sugar in 15 minutes after treatment (to make sure it is greater than 70 mg/dL). If your blood sugar is not greater than 70 mg/dL on recheck, call [phone] for further instructions. Report your blood sugar to the short stay nurse when you get to Short Stay.  If you are admitted to the hospital after surgery: Your blood sugar will be checked by the staff and you will probably be given insulin after surgery (instead of oral diabetes medicines) to make sure you have good blood sugar levels. The goal for blood sugar control after surgery  is 80-180 mg/dL.  Do NOT Smoke (Tobacco/Vaping) for 24 hours prior to your procedure.  If you use a CPAP at night, you may bring your mask/headgear for your overnight stay.   You will be asked to remove any contacts, glasses, piercing's, hearing aid's, dentures/partials prior to surgery. Please bring cases for these items if needed.    Patients discharged the day of surgery will not be allowed to drive home, and someone needs to stay with them for 24 hours.  SURGICAL WAITING ROOM  VISITATION Patients may have no more than 2 support people in the waiting area - these visitors may rotate.   Pre-op nurse will coordinate an appropriate time for 1 ADULT support person, who may not rotate, to accompany patient in pre-op.  Children under the age of 54 must have an adult with them who is not the patient and must remain in the main waiting area with an adult.  If the patient needs to stay at the hospital during part of their recovery, the visitor guidelines for inpatient rooms apply.  Please refer to the [facility] website for the visitor guidelines for any additional information.   If you received a COVID test during your pre-op visit  it is requested that you wear a mask when out in public, stay away from anyone that may not be feeling well and notify your surgeon if you develop symptoms. If you have been in contact with anyone that has tested positive in the last 10 days please notify you surgeon.      Pre-operative 5 CHG Bathing Instructions   You can play a key role in reducing the risk of infection after surgery. Your skin needs to be as free of germs as possible. You can reduce the number of germs on your skin by washing with CHG (chlorhexidine gluconate) soap before surgery. CHG is an antiseptic soap that kills germs and continues to kill germs even after washing.   DO NOT use if you have an allergy to chlorhexidine/CHG or antibacterial soaps. If your skin becomes reddened or irritated, stop using the CHG and notify one of our RNs at [phone].   Please shower with the CHG soap starting 4 days before surgery using the following schedule:     Please keep in mind the following:  DO NOT shave, including legs and underarms, starting the day of your first shower.   You may shave your face at any point before/day of surgery.  Place clean sheets on your bed the day you start using CHG soap. Use a clean washcloth (not used since being washed) for each shower. DO NOT  sleep with pets once you start using the CHG.   CHG Shower Instructions:  Wash your face and private area with normal soap. If you choose to wash your hair, wash first with your normal shampoo.  After you use shampoo/soap, rinse your hair and body thoroughly to remove shampoo/soap residue.  Turn the water OFF and apply about 3 tablespoons (45 ml) of CHG soap to a CLEAN washcloth.  Apply CHG soap ONLY FROM YOUR NECK DOWN TO YOUR TOES (washing for 3-5 minutes)  DO NOT use CHG soap on face, private areas, open wounds, or sores.  Pay special attention to the area where your surgery is being performed.  If you are having back surgery, having someone wash your back for you may be helpful. Wait 2 minutes after CHG soap is applied, then you may rinse off the CHG soap.  Pat dry with a clean towel  Put on clean clothes/pajamas   If you choose to wear lotion, please use ONLY the CHG-compatible lotions that are listed below.  Additional instructions for the day of surgery: DO NOT APPLY any lotions, deodorants, cologne, or perfumes.   Do not bring valuables to the hospital. [facility] is not responsible for any belongings/valuables. Do not wear nail polish, gel polish, artificial nails, or any other type of covering on natural nails (fingers and toes) Do not wear jewelry or makeup Put on clean/comfortable clothes.  Please brush your teeth.  Ask your nurse before applying any prescription medications to the skin.     CHG Compatible Lotions   Aveeno Moisturizing lotion  Cetaphil Moisturizing Cream  Cetaphil Moisturizing Lotion  Clairol Herbal Essence Moisturizing Lotion, Dry Skin  Clairol Herbal Essence Moisturizing Lotion, Extra Dry Skin  Clairol Herbal Essence Moisturizing Lotion, Normal Skin  Curel Age Defying Therapeutic Moisturizing Lotion with Alpha Hydroxy  Curel Extreme Care Body Lotion  Curel Soothing Hands Moisturizing Hand Lotion  Curel Therapeutic Moisturizing Cream,  Fragrance-Free  Curel Therapeutic Moisturizing Lotion, Fragrance-Free  Curel Therapeutic Moisturizing Lotion, Original Formula  Eucerin Daily Replenishing Lotion  Eucerin Dry Skin Therapy Plus Alpha Hydroxy Crme  Eucerin Dry Skin Therapy Plus Alpha Hydroxy Lotion  Eucerin Original Crme  Eucerin Original Lotion  Eucerin Plus Crme Eucerin Plus Lotion  Eucerin TriLipid Replenishing Lotion  Keri Anti-Bacterial Hand Lotion  Keri Deep Conditioning Original Lotion Dry Skin Formula Softly Scented  Keri Deep Conditioning Original Lotion, Fragrance Free Sensitive Skin Formula  Keri Lotion Fast Absorbing Fragrance Free Sensitive Skin Formula  Keri Lotion Fast Absorbing Softly Scented Dry Skin Formula  Keri Original Lotion  Keri Skin Renewal Lotion Keri Silky Smooth Lotion  Keri Silky Smooth Sensitive Skin Lotion  Nivea Body Creamy Conditioning Oil  Nivea Body Extra Enriched Lotion  Nivea Body Original Lotion  Nivea Body Sheer Moisturizing Lotion Nivea Crme  Nivea Skin Firming Lotion  NutraDerm 30 Skin Lotion  NutraDerm Skin Lotion  NutraDerm Therapeutic Skin Cream  NutraDerm Therapeutic Skin Lotion  ProShield Protective Hand Cream  Provon moisturizing lotion  Please read over the following fact sheets that you were given.

## 2023-02-10 ENCOUNTER — Encounter (HOSPITAL_COMMUNITY)
Admission: RE | Admit: 2023-02-10 | Discharge: 2023-02-10 | Disposition: A | Discharge status: Home / Self Care | Payer: Medicare PPO | Source: Ambulatory Visit | Attending: Neurosurgery | Admitting: Neurosurgery

## 2023-02-10 ENCOUNTER — Other Ambulatory Visit: Payer: Self-pay

## 2023-02-10 ENCOUNTER — Encounter (HOSPITAL_COMMUNITY): Payer: Self-pay

## 2023-02-10 VITALS — BP 146/76 | HR 78 | Temp 98.2°F | Resp 20 | Ht 62.0 in | Wt 183.4 lb

## 2023-02-10 DIAGNOSIS — Z01812 Encounter for preprocedural laboratory examination: Secondary | ICD-10-CM | POA: Diagnosis not present

## 2023-02-10 DIAGNOSIS — Z01818 Encounter for other preprocedural examination: Secondary | ICD-10-CM

## 2023-02-10 LAB — CBC
HCT: 45.3 % (ref 36.0–46.0)
Hemoglobin: 14.9 g/dL (ref 12.0–15.0)
MCH: 31.1 pg (ref 26.0–34.0)
MCHC: 32.9 g/dL (ref 30.0–36.0)
MCV: 94.6 fL (ref 80.0–100.0)
Platelets: 182 10*3/uL (ref 150–400)
RBC: 4.79 MIL/uL (ref 3.87–5.11)
RDW: 12.7 % (ref 11.5–15.5)
WBC: 4.9 10*3/uL (ref 4.0–10.5)
nRBC: 0 % (ref 0.0–0.2)

## 2023-02-10 LAB — BASIC METABOLIC PANEL
Anion gap: 6 (ref 5–15)
BUN: 12 mg/dL (ref 8–23)
CO2: 29 mmol/L (ref 22–32)
Calcium: 9.5 mg/dL (ref 8.9–10.3)
Chloride: 104 mmol/L (ref 98–111)
Creatinine, Ser: 0.95 mg/dL (ref 0.44–1.00)
GFR, Estimated: >60 mL/min (ref >60)
Glucose, Bld: 80 mg/dL (ref 70–99)
Potassium: 4.3 mmol/L (ref 3.5–5.1)
Sodium: 139 mmol/L (ref 135–145)

## 2023-02-10 LAB — TYPE AND SCREEN
ABO/RH(D): O POS
Antibody Screen: NEGATIVE

## 2023-02-10 LAB — SURGICAL PCR SCREEN

## 2023-02-10 NOTE — Progress Notes (Signed)
PCP - Dr. Dorette Grate at [facility] Cardiologist - Dr. Rollene Rotunda  PPM/ICD - Denies Device Orders - n/a Rep Notified - n/a  Chest x-ray - n/a EKG - 05-23-22 Stress Test - denies ECHO - denies Cardiac Cath - denies  Sleep Study - Yes, was diagnosed but has lost 40 lbs so she states no longer being treated for that CPAP - never had one  NON-diabetic  Last dose of GLP1 agonist-  Mounjara (weigh loss) GLP1 instructions: last dose she took on 02/08/23  Blood Thinner Instructions: Denies Aspirin Instructions: Denies  ERAS Protcol - ERAS till 0430 PRE-SURGERY Ensure or G2- none  COVID TEST- N   Anesthesia review: N   Patient denies shortness of breath, fever, cough and chest pain at PAT appointment. Patient denies any respiratory issues at this time.    All instructions explained to the patient, with a verbal understanding of the material. Patient agrees to go over the instructions while at home for a better understanding. Patient also instructed to self quarantine after being tested for COVID-19. The opportunity to ask questions was provided.

## 2023-02-13 NOTE — Anesthesia Preprocedure Evaluation (Addendum)
Anesthesia Evaluation  Patient identified by MRN, date of birth, ID band Patient awake    Reviewed: Allergy & Precautions, H&P , NPO status , Patient's Chart, lab work & pertinent test results  Airway Mallampati: I  TM Distance: >3 FB Neck ROM: Full    Dental no notable dental hx. (+) Teeth Intact, Dental Advisory Given, Caps   Pulmonary neg pulmonary ROS   Pulmonary exam normal breath sounds clear to auscultation       Cardiovascular Exercise Tolerance: Good hypertension, Pt. on medications + CAD  negative cardio ROS Normal cardiovascular exam Rhythm:Regular Rate:Normal     Neuro/Psych  Neuromuscular disease negative neurological ROS  negative psych ROS   GI/Hepatic negative GI ROS, Neg liver ROS,,,  Endo/Other  negative endocrine ROS    Renal/GU negative Renal ROS  negative genitourinary   Musculoskeletal negative musculoskeletal ROS (+) Arthritis ,    Abdominal   Peds negative pediatric ROS (+)  Hematology negative hematology ROS (+)   Anesthesia Other Findings   Reproductive/Obstetrics negative OB ROS                             Anesthesia Physical Anesthesia Plan  ASA: 3  Anesthesia Plan: General   Post-op Pain Management: Tylenol PO (pre-op)*, Celebrex PO (pre-op)*, Gabapentin PO (pre-op)* and Dilaudid IV   Induction: Intravenous  PONV Risk Score and Plan: 3 and Ondansetron, Dexamethasone and Treatment may vary due to age or medical condition  Airway Management Planned: Oral ETT  Additional Equipment: None and ClearSight  Intra-op Plan:   Post-operative Plan: Extubation in OR  Informed Consent: I have reviewed the patients History and Physical, chart, labs and discussed the procedure including the risks, benefits and alternatives for the proposed anesthesia with the patient or authorized representative who has indicated his/her understanding and acceptance.        Plan Discussed with: Anesthesiologist and CRNA  Anesthesia Plan Comments: ( 2 LB IV's )       Anesthesia Quick Evaluation

## 2023-02-14 ENCOUNTER — Other Ambulatory Visit: Payer: Self-pay

## 2023-02-14 ENCOUNTER — Inpatient Hospital Stay (HOSPITAL_COMMUNITY): Payer: Medicare PPO

## 2023-02-14 ENCOUNTER — Inpatient Hospital Stay (HOSPITAL_COMMUNITY): Payer: Medicare PPO | Admitting: Anesthesiology

## 2023-02-14 ENCOUNTER — Encounter (HOSPITAL_COMMUNITY): Payer: Self-pay | Admitting: Neurosurgery

## 2023-02-14 ENCOUNTER — Inpatient Hospital Stay (HOSPITAL_COMMUNITY): Payer: Medicare PPO | Admitting: Physician Assistant

## 2023-02-14 ENCOUNTER — Inpatient Hospital Stay (HOSPITAL_COMMUNITY)
Admission: RE | Disposition: A | Discharge status: Home / Self Care | Payer: Self-pay | Source: Home / Self Care | Attending: Neurosurgery

## 2023-02-14 ENCOUNTER — Inpatient Hospital Stay (HOSPITAL_COMMUNITY)
Admission: RE | Admit: 2023-02-14 | Discharge: 2023-02-15 | DRG: 451 | Disposition: A | Discharge status: Home / Self Care | Payer: Medicare PPO | Attending: Neurosurgery | Admitting: Neurosurgery

## 2023-02-14 DIAGNOSIS — M4316 Spondylolisthesis, lumbar region: Secondary | ICD-10-CM | POA: Diagnosis not present

## 2023-02-14 DIAGNOSIS — Z833 Family history of diabetes mellitus: Secondary | ICD-10-CM

## 2023-02-14 DIAGNOSIS — Z885 Allergy status to narcotic agent status: Secondary | ICD-10-CM

## 2023-02-14 DIAGNOSIS — M4807 Spinal stenosis, lumbosacral region: Secondary | ICD-10-CM | POA: Diagnosis not present

## 2023-02-14 DIAGNOSIS — Z7985 Long-term (current) use of injectable non-insulin antidiabetic drugs: Secondary | ICD-10-CM

## 2023-02-14 DIAGNOSIS — M4317 Spondylolisthesis, lumbosacral region: Principal | ICD-10-CM

## 2023-02-14 DIAGNOSIS — Z6833 Body mass index (BMI) 33.0-33.9, adult: Secondary | ICD-10-CM | POA: Diagnosis not present

## 2023-02-14 DIAGNOSIS — Z8249 Family history of ischemic heart disease and other diseases of the circulatory system: Secondary | ICD-10-CM

## 2023-02-14 DIAGNOSIS — M2578 Osteophyte, vertebrae: Secondary | ICD-10-CM | POA: Diagnosis not present

## 2023-02-14 DIAGNOSIS — Z79899 Other long term (current) drug therapy: Secondary | ICD-10-CM

## 2023-02-14 DIAGNOSIS — M48061 Spinal stenosis, lumbar region without neurogenic claudication: Secondary | ICD-10-CM | POA: Diagnosis not present

## 2023-02-14 DIAGNOSIS — I10 Essential (primary) hypertension: Secondary | ICD-10-CM | POA: Diagnosis not present

## 2023-02-14 DIAGNOSIS — Z981 Arthrodesis status: Secondary | ICD-10-CM | POA: Diagnosis not present

## 2023-02-14 DIAGNOSIS — Z79890 Hormone replacement therapy (postmenopausal): Secondary | ICD-10-CM

## 2023-02-14 DIAGNOSIS — I2510 Atherosclerotic heart disease of native coronary artery without angina pectoris: Secondary | ICD-10-CM | POA: Diagnosis not present

## 2023-02-14 DIAGNOSIS — Z888 Allergy status to other drugs, medicaments and biological substances status: Secondary | ICD-10-CM

## 2023-02-14 DIAGNOSIS — E785 Hyperlipidemia, unspecified: Secondary | ICD-10-CM | POA: Diagnosis present

## 2023-02-14 DIAGNOSIS — Z01818 Encounter for other preprocedural examination: Secondary | ICD-10-CM

## 2023-02-14 DIAGNOSIS — E669 Obesity, unspecified: Secondary | ICD-10-CM | POA: Diagnosis not present

## 2023-02-14 DIAGNOSIS — M51369 Other intervertebral disc degeneration, lumbar region without mention of lumbar back pain or lower extremity pain: Principal | ICD-10-CM

## 2023-02-14 DIAGNOSIS — Z96642 Presence of left artificial hip joint: Secondary | ICD-10-CM | POA: Diagnosis not present

## 2023-02-14 DIAGNOSIS — Z471 Aftercare following joint replacement surgery: Secondary | ICD-10-CM | POA: Diagnosis not present

## 2023-02-14 HISTORY — PX: ABDOMINAL EXPOSURE: SHX5708

## 2023-02-14 HISTORY — PX: ANTERIOR LUMBAR FUSION: SHX1170

## 2023-02-14 LAB — SURGICAL PCR SCREEN
MRSA, PCR: NEGATIVE
Staphylococcus aureus: NEGATIVE

## 2023-02-14 LAB — ABO/RH: ABO/RH(D): O POS

## 2023-02-14 SURGERY — ANTERIOR LUMBAR FUSION 1 LEVEL
Anesthesia: General | Site: Spine Lumbar

## 2023-02-14 MED ORDER — FENTANYL CITRATE (PF) 100 MCG/2ML IJ SOLN
INTRAMUSCULAR | Status: AC
  Filled 2023-02-14: qty 2

## 2023-02-14 MED ORDER — KETAMINE HCL 50 MG/5ML IJ SOSY
PREFILLED_SYRINGE | INTRAMUSCULAR | Status: AC
  Filled 2023-02-14: qty 5

## 2023-02-14 MED ORDER — ORAL CARE MOUTH RINSE: 15.0000 mL | Freq: Once | OROMUCOSAL | Status: AC

## 2023-02-14 MED ORDER — THROMBIN 20000 UNITS EX SOLR: CUTANEOUS | Status: DC | PRN

## 2023-02-14 MED ORDER — AMLODIPINE BESYLATE 10 MG PO TABS
10.0000 mg | ORAL_TABLET | Freq: Every evening | ORAL | Status: DC
  Administered 2023-02-14: 10 mg via ORAL
  Filled 2023-02-14: qty 1

## 2023-02-14 MED ORDER — CO Q 10 100 MG PO CAPS: 100.0000 mg | ORAL_CAPSULE | Freq: Every evening | ORAL | Status: DC

## 2023-02-14 MED ORDER — SODIUM CHLORIDE 0.9% FLUSH
3.0000 mL | Freq: Two times a day (BID) | INTRAVENOUS | Status: DC
  Administered 2023-02-14 (×2): 3 mL via INTRAVENOUS

## 2023-02-14 MED ORDER — SODIUM CHLORIDE 0.9% FLUSH: 3.0000 mL | INTRAVENOUS | Status: DC | PRN

## 2023-02-14 MED ORDER — CEFAZOLIN SODIUM-DEXTROSE 1-4 GM/50ML-% IV SOLN
1.0000 g | Freq: Three times a day (TID) | INTRAVENOUS | Status: AC
  Administered 2023-02-14 (×2): 1 g via INTRAVENOUS
  Filled 2023-02-14 (×2): qty 50

## 2023-02-14 MED ORDER — 0.9 % SODIUM CHLORIDE (POUR BTL) OPTIME
TOPICAL | Status: DC | PRN
  Administered 2023-02-14: 1000 mL

## 2023-02-14 MED ORDER — KETAMINE HCL 50 MG/5ML IJ SOSY
PREFILLED_SYRINGE | INTRAMUSCULAR | Status: DC | PRN
  Administered 2023-02-14: 15 mg via INTRAVENOUS

## 2023-02-14 MED ORDER — CEFAZOLIN SODIUM-DEXTROSE 2-4 GM/100ML-% IV SOLN
INTRAVENOUS | Status: AC
  Filled 2023-02-14: qty 100

## 2023-02-14 MED ORDER — ACETAMINOPHEN 500 MG PO TABS
ORAL_TABLET | ORAL | Status: AC
  Administered 2023-02-14: 1000 mg via ORAL
  Filled 2023-02-14: qty 2

## 2023-02-14 MED ORDER — VITAMIN B-12 1000 MCG PO TABS
1000.0000 ug | ORAL_TABLET | Freq: Every evening | ORAL | Status: DC
  Administered 2023-02-14: 1000 ug via ORAL
  Filled 2023-02-14: qty 1

## 2023-02-14 MED ORDER — LEVOTHYROXINE SODIUM 88 MCG PO TABS
88.0000 ug | ORAL_TABLET | Freq: Every day | ORAL | Status: DC
  Administered 2023-02-15: 88 ug via ORAL
  Filled 2023-02-14: qty 1

## 2023-02-14 MED ORDER — FENTANYL CITRATE (PF) 100 MCG/2ML IJ SOLN
25.0000 ug | INTRAMUSCULAR | Status: DC | PRN
  Administered 2023-02-14 (×2): 50 ug via INTRAVENOUS

## 2023-02-14 MED ORDER — OMEGA-3-ACID ETHYL ESTERS 1 G PO CAPS
1.0000 g | ORAL_CAPSULE | Freq: Every day | ORAL | Status: DC
  Administered 2023-02-15: 1 g via ORAL
  Filled 2023-02-14: qty 1

## 2023-02-14 MED ORDER — DIAZEPAM 5 MG PO TABS
ORAL_TABLET | ORAL | Status: AC
  Filled 2023-02-14: qty 1

## 2023-02-14 MED ORDER — ONDANSETRON HCL 4 MG/2ML IJ SOLN: 4.0000 mg | Freq: Once | INTRAMUSCULAR | Status: DC | PRN

## 2023-02-14 MED ORDER — CHLORHEXIDINE GLUCONATE 0.12 % MT SOLN
15.0000 mL | Freq: Once | OROMUCOSAL | Status: AC
Start: 2023-02-14 — End: 2023-02-14

## 2023-02-14 MED ORDER — HYDROMORPHONE HCL 1 MG/ML IJ SOLN
INTRAMUSCULAR | Status: DC | PRN
  Administered 2023-02-14 (×2): .25 mg via INTRAVENOUS

## 2023-02-14 MED ORDER — COLLAGEN-VITAMIN C-BIOTIN 500-50-0.8 MG PO CAPS: ORAL_CAPSULE | Freq: Every day | ORAL | Status: DC

## 2023-02-14 MED ORDER — DEXAMETHASONE SODIUM PHOSPHATE 10 MG/ML IJ SOLN
INTRAMUSCULAR | Status: AC
  Filled 2023-02-14: qty 1

## 2023-02-14 MED ORDER — PROPOFOL 10 MG/ML IV BOLUS
INTRAVENOUS | Status: DC | PRN
  Administered 2023-02-14: 200 mg via INTRAVENOUS

## 2023-02-14 MED ORDER — BUPIVACAINE HCL (PF) 0.25 % IJ SOLN
INTRAMUSCULAR | Status: DC | PRN
  Administered 2023-02-14: 10 mL

## 2023-02-14 MED ORDER — POLYETHYLENE GLYCOL 3350 17 G PO PACK: 17.0000 g | PACK | Freq: Every day | ORAL | Status: DC | PRN

## 2023-02-14 MED ORDER — CHLORHEXIDINE GLUCONATE CLOTH 2 % EX PADS: 6.0000 | MEDICATED_PAD | Freq: Once | CUTANEOUS | Status: DC

## 2023-02-14 MED ORDER — ACETAMINOPHEN 325 MG PO TABS
325.0000 mg | ORAL_TABLET | ORAL | Status: DC | PRN
Start: 2023-02-14 — End: 2023-02-14

## 2023-02-14 MED ORDER — PHENYLEPHRINE 80 MCG/ML (10ML) SYRINGE FOR IV PUSH (FOR BLOOD PRESSURE SUPPORT)
PREFILLED_SYRINGE | INTRAVENOUS | Status: AC
  Filled 2023-02-14: qty 10

## 2023-02-14 MED ORDER — CYCLOSPORINE 0.05 % OP EMUL
1.0000 [drp] | Freq: Two times a day (BID) | OPHTHALMIC | Status: DC
  Administered 2023-02-15: 1 [drp] via OPHTHALMIC
  Filled 2023-02-14 (×3): qty 30

## 2023-02-14 MED ORDER — CELECOXIB 200 MG PO CAPS
ORAL_CAPSULE | ORAL | Status: AC
  Administered 2023-02-14: 200 mg via ORAL
  Filled 2023-02-14: qty 1

## 2023-02-14 MED ORDER — ROCURONIUM BROMIDE 10 MG/ML (PF) SYRINGE
PREFILLED_SYRINGE | INTRAVENOUS | Status: DC | PRN
  Administered 2023-02-14: 100 mg via INTRAVENOUS
  Administered 2023-02-14: 20 mg via INTRAVENOUS

## 2023-02-14 MED ORDER — ROCURONIUM BROMIDE 10 MG/ML (PF) SYRINGE
PREFILLED_SYRINGE | INTRAVENOUS | Status: AC
  Filled 2023-02-14: qty 10

## 2023-02-14 MED ORDER — FISH OIL ULTRA 1400 MG PO CAPS: 1400.0000 mg | ORAL_CAPSULE | Freq: Every day | ORAL | Status: DC

## 2023-02-14 MED ORDER — OXYCODONE HCL 5 MG/5ML PO SOLN: 5.0000 mg | Freq: Once | ORAL | Status: DC | PRN

## 2023-02-14 MED ORDER — DIPHENHYDRAMINE HCL 50 MG/ML IJ SOLN
INTRAMUSCULAR | Status: AC
  Filled 2023-02-14: qty 1

## 2023-02-14 MED ORDER — LISINOPRIL 10 MG PO TABS
10.0000 mg | ORAL_TABLET | Freq: Every evening | ORAL | Status: DC
  Administered 2023-02-14: 10 mg via ORAL
  Filled 2023-02-14: qty 1

## 2023-02-14 MED ORDER — HYDROCHLOROTHIAZIDE 25 MG PO TABS
25.0000 mg | ORAL_TABLET | Freq: Every day | ORAL | Status: DC
  Administered 2023-02-14 – 2023-02-15 (×2): 25 mg via ORAL
  Filled 2023-02-14 (×2): qty 1

## 2023-02-14 MED ORDER — HYDROMORPHONE HCL 1 MG/ML IJ SOLN
INTRAMUSCULAR | Status: AC
  Filled 2023-02-14: qty 0.5

## 2023-02-14 MED ORDER — CELECOXIB 200 MG PO CAPS
200.0000 mg | ORAL_CAPSULE | Freq: Once | ORAL | Status: AC
Start: 2023-02-14 — End: 2023-02-14

## 2023-02-14 MED ORDER — FENTANYL CITRATE (PF) 250 MCG/5ML IJ SOLN
INTRAMUSCULAR | Status: DC | PRN
  Administered 2023-02-14 (×2): 50 ug via INTRAVENOUS
  Administered 2023-02-14: 100 ug via INTRAVENOUS
  Administered 2023-02-14: 50 ug via INTRAVENOUS

## 2023-02-14 MED ORDER — BISACODYL 10 MG RE SUPP: 10.0000 mg | Freq: Every day | RECTAL | Status: DC | PRN

## 2023-02-14 MED ORDER — LIDOCAINE 2% (20 MG/ML) 5 ML SYRINGE
INTRAMUSCULAR | Status: AC
  Filled 2023-02-14: qty 5

## 2023-02-14 MED ORDER — HYDROCODONE-ACETAMINOPHEN 5-325 MG PO TABS
ORAL_TABLET | ORAL | Status: AC
  Filled 2023-02-14: qty 2

## 2023-02-14 MED ORDER — PROPOFOL 10 MG/ML IV BOLUS
INTRAVENOUS | Status: AC
  Filled 2023-02-14: qty 20

## 2023-02-14 MED ORDER — ACETAMINOPHEN 500 MG PO TABS: 1000.0000 mg | ORAL_TABLET | Freq: Once | ORAL | Status: AC

## 2023-02-14 MED ORDER — ADULT MULTIVITAMIN W/MINERALS CH
1.0000 | ORAL_TABLET | Freq: Every day | ORAL | Status: DC
  Administered 2023-02-15: 1 via ORAL
  Filled 2023-02-14: qty 1

## 2023-02-14 MED ORDER — ROSUVASTATIN CALCIUM 20 MG PO TABS
20.0000 mg | ORAL_TABLET | Freq: Every evening | ORAL | Status: DC
  Administered 2023-02-14: 20 mg via ORAL
  Filled 2023-02-14: qty 1

## 2023-02-14 MED ORDER — FLEET ENEMA RE ENEM: 1.0000 | ENEMA | Freq: Once | RECTAL | Status: DC | PRN

## 2023-02-14 MED ORDER — ACETAMINOPHEN 325 MG PO TABS
650.0000 mg | ORAL_TABLET | ORAL | Status: DC | PRN
  Administered 2023-02-14: 325 mg via ORAL
  Filled 2023-02-14: qty 2

## 2023-02-14 MED ORDER — DEXAMETHASONE SODIUM PHOSPHATE 10 MG/ML IJ SOLN
INTRAMUSCULAR | Status: DC | PRN
  Administered 2023-02-14: 10 mg via INTRAVENOUS

## 2023-02-14 MED ORDER — PANTOPRAZOLE SODIUM 40 MG PO TBEC
40.0000 mg | DELAYED_RELEASE_TABLET | Freq: Every day | ORAL | Status: DC
  Administered 2023-02-14 – 2023-02-15 (×2): 40 mg via ORAL
  Filled 2023-02-14 (×2): qty 1

## 2023-02-14 MED ORDER — THROMBIN 20000 UNITS EX SOLR
CUTANEOUS | Status: AC
  Filled 2023-02-14: qty 20000

## 2023-02-14 MED ORDER — SUGAMMADEX SODIUM 200 MG/2ML IV SOLN
INTRAVENOUS | Status: DC | PRN
  Administered 2023-02-14: 200 mg via INTRAVENOUS

## 2023-02-14 MED ORDER — CYCLOSPORINE (PF) 0.09 % OP SOLN: 1.0000 [drp] | Freq: Two times a day (BID) | OPHTHALMIC | Status: DC

## 2023-02-14 MED ORDER — MIDAZOLAM HCL 2 MG/2ML IJ SOLN
INTRAMUSCULAR | Status: DC | PRN
  Administered 2023-02-14: 2 mg via INTRAVENOUS

## 2023-02-14 MED ORDER — SENNOSIDES-DOCUSATE SODIUM 8.6-50 MG PO TABS
1.0000 | ORAL_TABLET | Freq: Two times a day (BID) | ORAL | Status: DC
  Administered 2023-02-14 – 2023-02-15 (×3): 1 via ORAL
  Filled 2023-02-14 (×3): qty 1

## 2023-02-14 MED ORDER — LACTATED RINGERS IV SOLN: INTRAVENOUS | Status: DC

## 2023-02-14 MED ORDER — SODIUM CHLORIDE 0.9 % IV SOLN
250.0000 mL | INTRAVENOUS | Status: AC
  Administered 2023-02-14: 250 mL via INTRAVENOUS

## 2023-02-14 MED ORDER — HYDROMORPHONE HCL 1 MG/ML IJ SOLN
1.0000 mg | INTRAMUSCULAR | Status: DC | PRN
  Administered 2023-02-14: 1 mg via INTRAVENOUS
  Filled 2023-02-14: qty 1

## 2023-02-14 MED ORDER — MENTHOL 3 MG MT LOZG: 1.0000 | LOZENGE | OROMUCOSAL | Status: DC | PRN

## 2023-02-14 MED ORDER — ACETAMINOPHEN 160 MG/5ML PO SOLN: 325.0000 mg | ORAL | Status: DC | PRN

## 2023-02-14 MED ORDER — MICONAZOLE NITRATE POWD: Freq: Two times a day (BID) | Status: DC

## 2023-02-14 MED ORDER — HYDROCODONE-ACETAMINOPHEN 10-325 MG PO TABS
1.0000 | ORAL_TABLET | ORAL | Status: DC | PRN
  Administered 2023-02-14 – 2023-02-15 (×3): 1 via ORAL
  Filled 2023-02-14 (×3): qty 1

## 2023-02-14 MED ORDER — APOAEQUORIN 10 MG PO CAPS: 10.0000 mg | ORAL_CAPSULE | Freq: Every day | ORAL | Status: DC

## 2023-02-14 MED ORDER — MICONAZOLE NITRATE 2 % EX POWD: 1.0000 | Freq: Two times a day (BID) | CUTANEOUS | Status: DC

## 2023-02-14 MED ORDER — MIDAZOLAM HCL 2 MG/2ML IJ SOLN
INTRAMUSCULAR | Status: AC
  Filled 2023-02-14: qty 2

## 2023-02-14 MED ORDER — MEPERIDINE HCL 25 MG/ML IJ SOLN: 6.2500 mg | INTRAMUSCULAR | Status: DC | PRN

## 2023-02-14 MED ORDER — ONDANSETRON HCL 4 MG/2ML IJ SOLN: 4.0000 mg | Freq: Four times a day (QID) | INTRAMUSCULAR | Status: DC | PRN

## 2023-02-14 MED ORDER — OXYCODONE HCL 5 MG PO TABS: 5.0000 mg | ORAL_TABLET | Freq: Once | ORAL | Status: DC | PRN

## 2023-02-14 MED ORDER — ACETAMINOPHEN 650 MG RE SUPP: 650.0000 mg | RECTAL | Status: DC | PRN

## 2023-02-14 MED ORDER — CEFAZOLIN SODIUM-DEXTROSE 2-4 GM/100ML-% IV SOLN
2.0000 g | INTRAVENOUS | Status: AC
  Administered 2023-02-14: 2 g via INTRAVENOUS

## 2023-02-14 MED ORDER — ONDANSETRON HCL 4 MG/2ML IJ SOLN
INTRAMUSCULAR | Status: DC | PRN
  Administered 2023-02-14: 4 mg via INTRAVENOUS

## 2023-02-14 MED ORDER — CHLORHEXIDINE GLUCONATE 0.12 % MT SOLN
OROMUCOSAL | Status: AC
  Administered 2023-02-14: 15 mL via OROMUCOSAL
  Filled 2023-02-14: qty 15

## 2023-02-14 MED ORDER — BISACODYL 5 MG PO TBEC
5.0000 mg | DELAYED_RELEASE_TABLET | Freq: Every day | ORAL | Status: DC | PRN
  Filled 2023-02-14: qty 1

## 2023-02-14 MED ORDER — PHENYLEPHRINE HCL-NACL 20-0.9 MG/250ML-% IV SOLN
INTRAVENOUS | Status: DC | PRN
  Administered 2023-02-14: 15 ug/min via INTRAVENOUS

## 2023-02-14 MED ORDER — OYSTER SHELL CALCIUM/D3 500-5 MG-MCG PO TABS
1.0000 | ORAL_TABLET | Freq: Every evening | ORAL | Status: DC
  Administered 2023-02-14: 1 via ORAL
  Filled 2023-02-14: qty 1

## 2023-02-14 MED ORDER — MIDAZOLAM HCL 2 MG/2ML IJ SOLN: 2.0000 mg | Freq: Once | INTRAMUSCULAR | Status: DC

## 2023-02-14 MED ORDER — PHENOL 1.4 % MT LIQD: 1.0000 | OROMUCOSAL | Status: DC | PRN

## 2023-02-14 MED ORDER — FENTANYL CITRATE (PF) 250 MCG/5ML IJ SOLN
INTRAMUSCULAR | Status: AC
  Filled 2023-02-14: qty 5

## 2023-02-14 MED ORDER — ONDANSETRON HCL 4 MG PO TABS
4.0000 mg | ORAL_TABLET | Freq: Four times a day (QID) | ORAL | Status: DC | PRN
Start: 2023-02-14 — End: 2023-02-15

## 2023-02-14 MED ORDER — DIPHENHYDRAMINE HCL 50 MG/ML IJ SOLN
INTRAMUSCULAR | Status: DC | PRN
  Administered 2023-02-14: 12.5 mg via INTRAVENOUS

## 2023-02-14 MED ORDER — BUPIVACAINE HCL (PF) 0.25 % IJ SOLN
INTRAMUSCULAR | Status: AC
  Filled 2023-02-14: qty 30

## 2023-02-14 MED ORDER — LIDOCAINE 2% (20 MG/ML) 5 ML SYRINGE
INTRAMUSCULAR | Status: DC | PRN
  Administered 2023-02-14: 100 mg via INTRAVENOUS

## 2023-02-14 MED ORDER — DIAZEPAM 5 MG PO TABS
5.0000 mg | ORAL_TABLET | Freq: Four times a day (QID) | ORAL | Status: DC | PRN
  Administered 2023-02-14 – 2023-02-15 (×3): 5 mg via ORAL
  Filled 2023-02-14 (×2): qty 1

## 2023-02-14 SURGICAL SUPPLY — 73 items
ANCHOR LUMBAR 25 MIS (Anchor) IMPLANT
APPLIER CLIP 11 MED OPEN (CLIP) ×2
APPLIER CLIP 9.375 SM OPEN (CLIP) ×2
BAG COUNTER SPONGE SURGICOUNT (BAG) ×4 IMPLANT
BENZOIN TINCTURE PRP APPL 2/3 (GAUZE/BANDAGES/DRESSINGS) IMPLANT
BUR MATCHSTICK NEURO 3.0 LAGG (BURR) IMPLANT
CANISTER SUCT 3000ML PPV (MISCELLANEOUS) ×2 IMPLANT
CLIP APPLIE 11 MED OPEN (CLIP) ×2 IMPLANT
CLIP APPLIE 9.375 SM OPEN (CLIP) ×2 IMPLANT
CLIP TI MEDIUM 24 (CLIP) ×2 IMPLANT
CLIP TI MEDIUM 6 (CLIP) ×2 IMPLANT
CLIP TI WIDE RED SMALL 24 (CLIP) ×2 IMPLANT
CLIP TI WIDE RED SMALL 6 (CLIP) ×2 IMPLANT
DERMABOND ADVANCED .7 DNX12 (GAUZE/BANDAGES/DRESSINGS) IMPLANT
DRAPE C-ARM 42X72 X-RAY (DRAPES) ×6 IMPLANT
DRAPE C-ARMOR (DRAPES) ×2 IMPLANT
DRAPE LAPAROTOMY 100X72X124 (DRAPES) ×2 IMPLANT
DRSG OPSITE POSTOP 4X6 (GAUZE/BANDAGES/DRESSINGS) IMPLANT
ELECT BLADE 4.0 EZ CLEAN MEGAD (MISCELLANEOUS) ×2
ELECT BLADE 6.5 EXT (BLADE) ×2 IMPLANT
ELECT REM PT RETURN 9FT ADLT (ELECTROSURGICAL) ×2
ELECTRODE BLDE 4.0 EZ CLN MEGD (MISCELLANEOUS) ×2 IMPLANT
ELECTRODE REM PT RTRN 9FT ADLT (ELECTROSURGICAL) ×2 IMPLANT
GAUZE 4X4 16PLY ~~LOC~~+RFID DBL (SPONGE) IMPLANT
GAUZE SPONGE 4X4 12PLY STRL (GAUZE/BANDAGES/DRESSINGS) ×2 IMPLANT
GLOVE BIO SURGEON STRL SZ 6.5 (GLOVE) ×2 IMPLANT
GLOVE BIOGEL PI IND STRL 6.5 (GLOVE) ×2 IMPLANT
GLOVE BIOGEL PI IND STRL 8 (GLOVE) ×2 IMPLANT
GLOVE ECLIPSE 9.0 STRL (GLOVE) ×2 IMPLANT
GLOVE EXAM NITRILE XL STR (GLOVE) IMPLANT
GLOVE SS BIOGEL STRL SZ 7.5 (GLOVE) ×2 IMPLANT
GOWN STRL REUS W/ TWL LRG LVL3 (GOWN DISPOSABLE) ×2 IMPLANT
GOWN STRL REUS W/ TWL XL LVL3 (GOWN DISPOSABLE) ×2 IMPLANT
GOWN STRL REUS W/TWL 2XL LVL3 (GOWN DISPOSABLE) ×4 IMPLANT
GRAFT TRINITY ELITE LGE HUMAN (Tissue) IMPLANT
INSERT FOGARTY 61MM (MISCELLANEOUS) IMPLANT
INSERT FOGARTY SM (MISCELLANEOUS) IMPLANT
KIT BASIN OR (CUSTOM PROCEDURE TRAY) ×2 IMPLANT
NDL HYPO 22X1.5 SAFETY MO (MISCELLANEOUS) ×2 IMPLANT
NDL SPNL 18GX3.5 QUINCKE PK (NEEDLE) ×2 IMPLANT
NEEDLE HYPO 22X1.5 SAFETY MO (MISCELLANEOUS) ×2
NEEDLE SPNL 18GX3.5 QUINCKE PK (NEEDLE) ×2
NS IRRIG 1000ML POUR BTL (IV SOLUTION) ×2 IMPLANT
PACK LAMINECTOMY NEURO (CUSTOM PROCEDURE TRAY) ×2 IMPLANT
PAD ARMBOARD 7.5X6 YLW CONV (MISCELLANEOUS) ×4 IMPLANT
ROD SPINAL THRD DISP (MISCELLANEOUS) IMPLANT
SPACER HEDRON IA 29X39 15 15D (Spacer) IMPLANT
SPONGE INTESTINAL PEANUT (DISPOSABLE) ×4 IMPLANT
SPONGE SURGIFOAM ABS GEL 100 (HEMOSTASIS) ×4 IMPLANT
SPONGE T-LAP 18X18 ~~LOC~~+RFID (SPONGE) ×2 IMPLANT
SPONGE T-LAP 4X18 ~~LOC~~+RFID (SPONGE) ×4 IMPLANT
STAPLER VISISTAT (STAPLE) ×2 IMPLANT
STRIP CLOSURE SKIN 1/2X4 (GAUZE/BANDAGES/DRESSINGS) IMPLANT
SUT PDS AB 1 CTX 36 (SUTURE) ×4 IMPLANT
SUT PROLENE 4-0 RB1 .5 CRCL 36 (SUTURE) IMPLANT
SUT PROLENE 5 0 CC1 (SUTURE) IMPLANT
SUT PROLENE 6 0 C 1 30 (SUTURE) ×2 IMPLANT
SUT PROLENE 6 0 CC (SUTURE) IMPLANT
SUT SILK 0 TIES 10X30 (SUTURE) ×2 IMPLANT
SUT SILK 2 0 TIES 10X30 (SUTURE) ×4 IMPLANT
SUT SILK 2 0SH CR/8 30 (SUTURE) IMPLANT
SUT SILK 3 0 SH CR/8 (SUTURE) IMPLANT
SUT SILK 3 0SH CR/8 30 (SUTURE) IMPLANT
SUT SILK 3-0 18XBRD TIE BLK (SUTURE) ×2 IMPLANT
SUT VIC AB 0 CT1 18XCR BRD8 (SUTURE) ×2 IMPLANT
SUT VIC AB 0 CT1 27XBRD ANBCTR (SUTURE) ×4 IMPLANT
SUT VIC AB 0 CT1 27XBRD ANTBC (SUTURE) IMPLANT
SUT VIC AB 2-0 CT1 18 (SUTURE) ×2 IMPLANT
SUT VIC AB 3-0 SH 8-18 (SUTURE) ×2 IMPLANT
TOWEL GREEN STERILE (TOWEL DISPOSABLE) ×4 IMPLANT
TOWEL GREEN STERILE FF (TOWEL DISPOSABLE) ×2 IMPLANT
TRAY FOLEY MTR SLVR 16FR STAT (SET/KITS/TRAYS/PACK) ×2 IMPLANT
WATER STERILE IRR 1000ML POUR (IV SOLUTION) ×2 IMPLANT

## 2023-02-14 NOTE — Discharge Instructions (Signed)
Wound Care Keep incision covered and dry until post op day 3. You may remove the Honeycomb dressing on post op day 3. Leave steri-strips on back.  They will fall off by themselves. Do not put any creams, lotions, or ointments on incision. You are fine to shower. Let water run over incision and pat dry.   Activity Walk each and every day, increasing distance each day. No lifting greater than 8 lbs.  No lifting no bending no twisting no driving , you can ride as a passenger locally If provided with back brace, wear when out of bed.  It is not necessary to wear brace in bed.  Diet Resume your normal diet.   Return to Work Will be discussed at your follow up appointment.  Call Your Doctor If Any of These Occur Redness, drainage, or swelling at the wound.  Temperature greater than 101 degrees. Severe pain not relieved by pain medication. Incision starts to come apart.  Follow Up Appt Call [phone] if you have one or any problem.

## 2023-02-14 NOTE — Op Note (Signed)
Date of procedure: 02/14/2023  Date of dictation: Same  Service: Neurosurgery  Preoperative diagnosis: L5-S1 adjacent level degeneration status post prior L3-L5 posterior fusion with degenerative retrolisthesis and stenosis  Postoperative diagnosis: Same  Procedure Name: L5-S1 anterior lumbar interbody fusion utilizing interbody cage, morselized allograft, and anterior plate fixation  Surgeon:Jadine Brumley A.Costa Jha, M.D.  Asst. Surgeon: Doran Durand, NP  Vascular surgeons: Dr. Sherral Hammers, Dr. Chestine Spore  Anesthesia: General  Indication: 68 year old female remotely status post L3-4 and L4-5 decompression and fusion.  Patient with worsening back and bilateral posterior lower extremity symptoms failing conservative management.  Workup demonstrates evidence of severe adjacent segment disease at L2-3 and L5-S1.  At L5-S1 she has marked to space collapse with degenerative retrolisthesis and significant neuroforaminal stenosis from which she is felt to be primarily symptomatic.  Patient has failed conservative management presents now for anterior lumbar interbody fusion L5-S1.  Operative note: After induction of anesthesia, patient positioned supine on the operative bed with her arms outstretched.  Patient's lower abdominal region was prepped and draped sterilely.  Dr. Sherral Hammers and Dr. Chestine Spore then performed a retroperitoneal exposure of the L5-S1 interspace through a left lower abdominal incision.  Self-retaining retractor system was placed.  I confirmed the level.  I marked the midline.  I ensured safe positioning of the retractor system.  Disc base was then incised.  Discectomy was then performed using various instruments to remove the entire disc count of the posterior annulus.  Remaining aspects of annulus and osteophytes were removed using Kerrison rongeurs down to level the posterior logical ligament.  Posterior logical was elevated and resected.  Vertebral bodies of L5 and S1 were undercut.  Decompression then proceeded  into the neural foramina bilaterally.  Foraminotomies complete on the course the exiting L5 nerve roots bilaterally..  At this point a very thorough decompression of been achieved.  Endplates were prepared.  Disc base was sequentially distracted.  A 15 mm x 15 degree large globus medical implant was found to be most appropriate.  A 3D printed 15 mm 15 degree cage was then attached to an inserter and packed with Trinity allograft.  This was then impacted in the place confirming good position on the AP and lateral images.  The cage was recessed slightly from the anterior cortical margins.  Anterior plate fixation with locking stays was then achieved with 1 stay being driven into the L5 vertebral body and 2 stays being driven into the S1 body.  Locking mechanisms were engaged over both stays.  Final images reveal good position of the cage and the hardware at the proper operative level with normal alignment of the spine.  Wound was then irrigated.  The retractor system was removed.  There was no evidence of vascular injury.  Wound was then closed in layers with Vicryl sutures.  Steri-Strips and sterile dressing were applied.  No apparent complications.  Patient tolerated the procedure well and she returns to the recovery room postop.

## 2023-02-14 NOTE — Anesthesia Procedure Notes (Signed)
Procedure Name: Intubation Date/Time: 02/14/2023 8:06 AM  Performed by: Thomasene Ripple, CRNAPre-anesthesia Checklist: Patient identified, Emergency Drugs available, Suction available and Patient being monitored Patient Re-evaluated:Patient Re-evaluated prior to induction Oxygen Delivery Method: Circle System Utilized Preoxygenation: Pre-oxygenation with 100% oxygen Induction Type: IV induction Ventilation: Mask ventilation without difficulty Laryngoscope Size: Glidescope and 3 Grade View: Grade I Tube type: Oral Tube size: 6.5 mm Number of attempts: 2 Airway Equipment and Method: Stylet and Oral airway Placement Confirmation: ETT inserted through vocal cords under direct vision, positive ETCO2 and breath sounds checked- equal and bilateral Secured at: 21 cm Tube secured with: Tape Dental Injury: Teeth and Oropharynx as per pre-operative assessment  Comments: Initial DL with miller 3 grade 2 view obtained unable to pass size 7 ETT. Easy glidescope intubation

## 2023-02-14 NOTE — H&P (Signed)
Margaret Hancock is an 68 y.o. female.   Chief Complaint: Back pain HPI: 68 year old female status post prior L3-L5 decompression and fusion.  Patient presents with worsening back and bilateral lower extremity symptoms failing conservative management her workup demonstrates evidence of marked L5-S1 disc degeneration with degenerative retrolisthesis and foraminal stenosis.  Patient presents now for anterior lumbar interbody fusion after failing conservative management.  Past Medical History:  Diagnosis Date   Hyperlipidemia    Hypertension    OA (osteoarthritis)    Obesity    Osteoarthritis    Thyroid disease     Past Surgical History:  Procedure Laterality Date   CESAREAN SECTION     EYE SURGERY     Lasik -bilateral   FOOT SURGERY     LUMBAR FUSION     x 2   TOTAL HIP ARTHROPLASTY Left 08/16/2014   Procedure: LEFT TOTAL HIP ARTHROPLASTY ANTERIOR APPROACH;  Surgeon: Durene Romans, MD;  Location: [facility];  Service: Orthopedics;  Laterality: Left;    Family History  Problem Relation Age of Onset   Stroke Father 57   Hypertension Father    Stroke Mother    CAD Brother 54   Diabetes Sister 61   Social History:  reports that she has never smoked. She does not have any smokeless tobacco history on file. She reports current alcohol use. She reports that she does not use drugs.  Allergies:  Allergies  Allergen Reactions   Oxycodone Other (See Comments)    Patient states it makes her mean   Nitrous Oxide Nausea Only and Other (See Comments)    Medications Prior to Admission  Medication Sig Dispense Refill   acetaminophen (TYLENOL) 500 MG tablet Take 1,000 mg by mouth every 6 (six) hours as needed for moderate pain (pain score 4-6).     amLODipine (NORVASC) 10 MG tablet Take 10 mg by mouth every evening.     Apoaequorin (PREVAGEN) 10 MG CAPS Take 10 mg by mouth daily.     Calcium Carb-Cholecalciferol (CALCIUM 600 + D PO) Take 1 capsule by mouth every evening.     celecoxib (CELEBREX)  200 MG capsule Take 200 mg by mouth daily as needed for moderate pain (pain score 4-6).     Coenzyme Q10 (CO Q 10) 100 MG CAPS Take 100 mg by mouth every evening.     Collagen-Vitamin C-Biotin (COLLAGEN PO) Take 10 g by mouth daily. Powder collagen     CRANBERRY PO Take 1 capsule by mouth every evening.     cyanocobalamin (VITAMIN B12) 1000 MCG tablet Take 1,000 mcg by mouth every evening.     cycloSPORINE, PF, (CEQUA) 0.09 % SOLN Place 1 drop into both eyes 2 (two) times daily.     doxylamine, Sleep, (UNISOM) 25 MG tablet Take 25 mg by mouth at bedtime.     esomeprazole (NEXIUM) 20 MG capsule Take 20 mg by mouth every evening.     Glucosamine-Chondroitin (OSTEO BI-FLEX REGULAR STRENGTH PO) Take 1 tablet by mouth daily.     hydrochlorothiazide (HYDRODIURIL) 25 MG tablet Take 25 mg by mouth daily.     levothyroxine (SYNTHROID) 88 MCG tablet Take 88 mcg by mouth daily.     lisinopril (ZESTRIL) 10 MG tablet Take 10 mg by mouth every evening.     miconazole (MICOTIN) 2 % powder Apply 1 Application topically 2 (two) times daily.     Multiple Vitamin (MULTIVITAMIN ADULT PO) Take 1 tablet by mouth daily.     Omega-3  Fatty Acids (FISH OIL ULTRA) 1400 MG CAPS Take 1,400 mg by mouth daily.     rosuvastatin (CRESTOR) 20 MG tablet Take 20 mg by mouth every evening.     tirzepatide (MOUNJARO) 15 MG/0.5ML Pen Inject 15 mg into the skin once a week. 6 mL 3    No results found for this or any previous visit (from the past 48 hours). No results found.  Pertinent items noted in HPI and remainder of comprehensive ROS otherwise negative.  Blood pressure 138/69, pulse 79, temperature 98.6 F (37 C), temperature source Oral, resp. rate 20, height 5\' 2"  (1.575 m), weight 82.6 kg, SpO2 93%.  Patient is awake and alert.  She is oriented and appropriate.  Speech is fluent.  Judgment insight are intact.  Cranial nerve function normal bilateral.  Motor examination reveals intact motor strength bilateral sensory  examination with decrease sensation pinprick and light touch in her L5 dermatomes bilaterally.  Deep tendon reflexes normal active except her Achilles reflexes are absent.  Gait is antalgic.  Posture mildly flexed peer examination head ears eyes nose and throat is unremarkable chest and abdomen are benign.  Extremities are free from injury deformity. Assessment/Plan L5-S1 adjacent level degeneration with degenerative retrolisthesis and foraminal stenosis.  Plan L5-S1 anterior lumbar interbody fusion utilizing interbody cage and anterior fixation.  Risks and benefits of explained.  Patient wishes to proceed.  Kathaleen Maser Anahy Esh 02/14/2023, 7:36 AM

## 2023-02-14 NOTE — Plan of Care (Signed)

## 2023-02-14 NOTE — Brief Op Note (Signed)
02/14/2023  9:49 AM  PATIENT:  Margaret Hancock  68 y.o. female  PRE-OPERATIVE DIAGNOSIS:  Spondylolisthesis  POST-OPERATIVE DIAGNOSIS:  Spondylolisthesis  PROCEDURE:  Procedure(s): ANTERIOR LUMBAR INTERBODY FUSION LUMBAR FIVE-SACRAL ONE (N/A) ABDOMINAL EXPOSURE (N/A)  SURGEON:  Surgeons and Role: Panel 1:    Julio Sicks, MD - Primary Panel 2:    * Victorino Sparrow, MD - Primary    * Chestine Spore, Canary Brim, MD  PHYSICIAN ASSISTANT:   ASSISTANTSMarland Mcalpine   ANESTHESIA:   general  EBL:  20 cc   BLOOD ADMINISTERED:none  DRAINS: none   LOCAL MEDICATIONS USED:  MARCAINE     SPECIMEN:  No Specimen  DISPOSITION OF SPECIMEN:  N/A  COUNTS:  YES  TOURNIQUET:  * No tourniquets in log *  DICTATION: .Dragon Dictation  PLAN OF CARE: Admit to inpatient   PATIENT DISPOSITION:  PACU - hemodynamically stable.   Delay start of Pharmacological VTE agent (>24hrs) due to surgical blood loss or risk of bleeding: yes

## 2023-02-14 NOTE — H&P (Signed)
Office Note     Patient seen and examined in preop holding.  No complaints. No changes to medication history or physical exam since last seen in clinic. After discussing the risks and benefits of Anterior spine exposure to facilitate ALIF, Margaret Hancock elected to proceed.   Victorino Sparrow MD   CC:  ALIF L5-S1 Dr. Jordan Likes Requesting Provider:  No ref. provider found  HPI: Margaret Hancock is a 68 y.o. (07/14/1955) female presenting at the request of .Camie Patience, FNP for anterior exposure of the lumbar spine specifically at L5-S1 for interbody fusion.  On exam, Margaret Hancock was doing well.  A native of [place], she moved to [place] to attend [employer].  She subsequently stayed.  She is now retired, and is open to enjoy her retirement.  She has bilateral lower extremity pain and numbness which occurs in the hips after standing for roughly 10 minutes.  This is affecting her ambulation, and has severely limited her lifestyle.  She denies ischemic rest pain, tissue loss.  Margaret Hancock has a history of posterior implementation L3-L4-L5 in 2000 Abdominal history includes C-section.   Past Medical History:  Diagnosis Date   Hyperlipidemia    Hypertension    OA (osteoarthritis)    Obesity    Osteoarthritis    Thyroid disease     Past Surgical History:  Procedure Laterality Date   CESAREAN SECTION     EYE SURGERY     Lasik -bilateral   FOOT SURGERY     LUMBAR FUSION     x 2   TOTAL HIP ARTHROPLASTY Left 08/16/2014   Procedure: LEFT TOTAL HIP ARTHROPLASTY ANTERIOR APPROACH;  Surgeon: Durene Romans, MD;  Location: [facility];  Service: Orthopedics;  Laterality: Left;    Social History   Socioeconomic History   Marital status: Married    Spouse name: Not on file   Number of children: 3   Years of education: Not on file   Highest education level: Not on file  Occupational History   Not on file  Tobacco Use   Smoking status: Never   Smokeless tobacco: Not on file  Vaping Use    Vaping status: Never Used  Substance and Sexual Activity   Alcohol use: Yes    Comment: occassionally-wine   Drug use: No   Sexual activity: Yes  Other Topics Concern   Not on file  Social History Narrative   Lives at home with husband.    Social Drivers of Corporate investment banker Strain: Not on file  Food Insecurity: No Food Insecurity (01/31/2022)   Hunger Vital Sign    Worried About Running Out of Food in the Last Year: Never true    Ran Out of Food in the Last Year: Never true  Transportation Needs: No Transportation Needs (01/31/2022)   PRAPARE - Administrator, Civil Service (Medical): No    Lack of Transportation (Non-Medical): No  Physical Activity: Not on file  Stress: Not on file  Social Connections: Not on file  Intimate Partner Violence: Not on file   Family History  Problem Relation Age of Onset   Stroke Father 32   Hypertension Father    Stroke Mother    CAD Brother 47   Diabetes Sister 60    Current Facility-Administered Medications  Medication Dose Route Frequency Provider Last Rate Last Admin   ceFAZolin (ANCEF) 2-4 GM/100ML-% IVPB            ceFAZolin (  ANCEF) IVPB 2g/100 mL premix  2 g Intravenous On Call to OR Julio Sicks, MD       Chlorhexidine Gluconate Cloth 2 % PADS 6 each  6 each Topical Once Julio Sicks, MD       And   Chlorhexidine Gluconate Cloth 2 % PADS 6 each  6 each Topical Once Julio Sicks, MD       Chlorhexidine Gluconate Cloth 2 % PADS 6 each  6 each Topical Once Victorino Sparrow, MD       And   Chlorhexidine Gluconate Cloth 2 % PADS 6 each  6 each Topical Once Victorino Sparrow, MD       lactated ringers infusion   Intravenous Continuous Val Eagle, MD       midazolam (VERSED) injection 2 mg  2 mg Intravenous Once Bethena Midget, MD        Allergies  Allergen Reactions   Oxycodone Other (See Comments)    Patient states it makes her mean   Nitrous Oxide Nausea Only and Other (See Comments)     REVIEW OF  SYSTEMS:  [X]  denotes positive finding, [ ]  denotes negative finding Cardiac  Comments:  Chest pain or chest pressure:    Shortness of breath upon exertion:    Short of breath when lying flat:    Irregular heart rhythm:        Vascular    Pain in calf, thigh, or hip brought on by ambulation:    Pain in feet at night that wakes you up from your sleep:     Blood clot in your veins:    Leg swelling:         Pulmonary    Oxygen at home:    Productive cough:     Wheezing:         Neurologic    Sudden weakness in arms or legs:     Sudden numbness in arms or legs:     Sudden onset of difficulty speaking or slurred speech:    Temporary loss of vision in one eye:     Problems with dizziness:         Gastrointestinal    Blood in stool:     Vomited blood:         Genitourinary    Burning when urinating:     Blood in urine:        Psychiatric    Major depression:         Hematologic    Bleeding problems:    Problems with blood clotting too easily:        Skin    Rashes or ulcers:        Constitutional    Fever or chills:      PHYSICAL EXAMINATION:  Vitals:   02/14/23 0557  BP: 138/69  Pulse: 79  Resp: 20  Temp: 98.6 F (37 C)  TempSrc: Oral  SpO2: 93%  Weight: 82.6 kg  Height: 5\' 2"  (1.575 m)    General:  WDWN in NAD; vital signs documented above Gait: Not observed HENT: WNL, normocephalic Pulmonary: normal non-labored breathing , without wheezing Cardiac: regular HR Abdomen: soft, NT, no masses Skin: Yeast rash in the pannus. Vascular Exam/Pulses:  Right Left  Radial 2+ (normal) 2+ (normal)  Ulnar    Femoral    Popliteal    DP 2+ (normal) 2+ (normal)  PT     Extremities: without ischemic changes, without Gangrene , without cellulitis; without  open wounds;  Musculoskeletal: no muscle wasting or atrophy  Neurologic: A&O X 3;  No focal weakness or paresthesias are detected Psychiatric:  The pt has Normal affect.   Non-Invasive Vascular Imaging:    See MRI    ASSESSMENT/PLAN: Margaret Hancock is a 68 y.o. female presenting prior to anterior lumbar interbody fusion to discuss the anterior approach.  I had a long discussion with Margaret Hancock regarding surgery, as well as the risks and benefits of my portion, which will be the safe exposure of the L5-S1 disc space.  She is at slightly higher risk than normal due to the prior posterior fusion.  On exam, she had a small amount of erythema appreciated in the groin.  I have prescribed her miconazole powder, which I asked her to use twice daily prior to surgery.  Margaret Hancock is aware that the safe exposure of the disc space will be done with the help of my partner, Dr. Clotilde Dieter as an assistant.  After discussing the risks and benefits of the above, Margaret Hancock elected to proceed.   Victorino Sparrow, MD [facility] [phone] Total time of patient care including pre-visit research, consultation, and documentation greater than 30 minutes

## 2023-02-14 NOTE — Progress Notes (Signed)
Orthopedic Tech Progress Note Patient Details:  Margaret Hancock 07/13/1955 [id number]  Ortho Devices Type of Ortho Device: Lumbar corsett Ortho Device/Splint Location: Back Ortho Device/Splint Interventions: Ordered      Margaret Hancock [name] 02/14/2023, 3:13 PM

## 2023-02-14 NOTE — Op Note (Signed)
    NAME: Margaret Hancock    MRN: [id number] DOB: [birth year]    DATE OF OPERATION: 02/14/2023  PREOP DIAGNOSIS:    Degenerative lumbar disc disease L5-S1  POSTOP DIAGNOSIS:    Same  PROCEDURE:    Anterior spine exposure via anterior retroperitoneal approach for L5-S1 ALIF  SURGEON: Victorino Sparrow  ASSIST: Sherald Hess, MD  ANESTHESIA: General  EBL: 20 mL  INDICATIONS:    Margaret Hancock is a 68 y.o. female with previous history of L3-4, L4 4 5 decompression and fusion patient has had worsening back pain and was seen by Dr. Dutch Quint, neurosurgery.  L5-S1 space was found to be collapsed with degenerative disease.  After discussing the risks and benefits of anterior lumbar interbody fusion of the L5-S1, Margaret Hancock elected to proceed.  FINDINGS:   L5-S1 degenerative disc disease  TECHNIQUE:   Patient is brought to the operating room laid in the supine position.  General anesthesia was induced.  A C-arm was used in the lateral position to mark the L5-S1 disc space over the left rectus muscle.  The abdominal wall was then prepped and draped in standard fashion.   A transverse incision was made two fingerbreadths from the pubis along the left rectus muscle.  This was taken down to the fascia and the anterior fascia of the left rectus muscle opened.  The left rectus was mobilized.  Cautery was used to create flaps under the anterior fascia.  Blunt dissection was used to enter the retroperitoneum lateral to the anterior rectus.  The peritoneum was swept infero-medial.  With Dr. Chestine Spore, I was able to use hand-held Wiley retractors to pull the peritoneum and left ureter to midline.   I was able to visualize the L5-S1 disc space.  The middle sacral vessels were divided using clips.  I used a Kd to mobilize soft tissue directly on the anterior longitudinal ligament.  Special care was taken to ensure the left common iliac vein was mobilized.  The left external iliac system was in our field-of-view  and protected.   Next, the fixed NuVasive retractor was placed.  I used 160 mm reverse lip retractor blades x 3, with the caudad retractor 140 mm. A needle was used to confirm I was at the correct.  Both Dr. Jordan Likes and I agreed that we were.  I then turned the case over to Dr. Jordan Likes.  Victorino Sparrow, MD [facility] DATE OF DICTATION:   02/14/2023

## 2023-02-14 NOTE — Anesthesia Postprocedure Evaluation (Signed)
Anesthesia Post Note  Patient: Margaret Hancock  Procedure(s) Performed: ANTERIOR LUMBAR INTERBODY FUSION LUMBAR FIVE-SACRAL ONE (Spine Lumbar) ABDOMINAL EXPOSURE     Patient location during evaluation: PACU Anesthesia Type: General Level of consciousness: awake and alert Pain management: pain level controlled Vital Signs Assessment: post-procedure vital signs reviewed and stable Respiratory status: spontaneous breathing, nonlabored ventilation, respiratory function stable and patient connected to nasal cannula oxygen Cardiovascular status: blood pressure returned to baseline and stable Postop Assessment: no apparent nausea or vomiting Anesthetic complications: no   No notable events documented.  Last Vitals:  Vitals:   02/14/23 1030 02/14/23 1045  BP: (!) 124/44 116/65  Pulse: 74 72  Resp: 10 (!) 22  Temp:    SpO2: 95% 99%    Last Pain:  Vitals:   02/14/23 1045  TempSrc:   PainSc: 10-Worst pain ever                 [name]

## 2023-02-14 NOTE — Transfer of Care (Signed)
Immediate Anesthesia Transfer of Care Note  Patient: Margaret Hancock  Procedure(s) Performed: ANTERIOR LUMBAR INTERBODY FUSION LUMBAR FIVE-SACRAL ONE (Spine Lumbar) ABDOMINAL EXPOSURE  Patient Location: PACU  Anesthesia Type:General  Level of Consciousness: awake  Airway & Oxygen Therapy: Patient Spontanous Breathing and Patient connected to face mask oxygen  Post-op Assessment: Report given to RN and Post -op Vital signs reviewed and stable  Post vital signs: Reviewed and stable  Last Vitals:  Vitals Value Taken Time  BP 131/67 02/14/23 1016  Temp    Pulse 71 02/14/23 1022  Resp 11 02/14/23 1022  SpO2 92 % 02/14/23 1022  Vitals shown include unfiled device data.  Last Pain:  Vitals:   02/14/23 0613  TempSrc:   PainSc: 0-No pain         Complications: No notable events documented.

## 2023-02-15 MED ORDER — SENNOSIDES-DOCUSATE SODIUM 8.6-50 MG PO TABS: 1.0000 | ORAL_TABLET | Freq: Two times a day (BID) | ORAL | 0 refills | Status: DC

## 2023-02-15 MED ORDER — HYDROCODONE-ACETAMINOPHEN 10-325 MG PO TABS: 1.0000 | ORAL_TABLET | ORAL | 0 refills | Status: AC | PRN

## 2023-02-15 MED ORDER — METHOCARBAMOL 750 MG PO TABS: 750.0000 mg | ORAL_TABLET | Freq: Four times a day (QID) | ORAL | 0 refills | Status: DC

## 2023-02-15 NOTE — Progress Notes (Signed)
  Progress Note    02/15/2023 10:44 AM 1 Day Post-Op  Subjective:  feels like toothache left calf  Vitals:   02/15/23 0338 02/15/23 0747  BP: 137/68 (!) 125/59  Pulse: 83 82  Resp: 18 20  Temp: 98.5 F (36.9 C) 98.6 F (37 C)  SpO2: 94% 96%    Physical Exam: Aaox3 Non labored respirations Left leg without edema, 2+ L dp  CBC    Component Value Date/Time   WBC 4.9 02/10/2023 1136   RBC 4.79 02/10/2023 1136   HGB 14.9 02/10/2023 1136   HCT 45.3 02/10/2023 1136   PLT 182 02/10/2023 1136   MCV 94.6 02/10/2023 1136   MCH 31.1 02/10/2023 1136   MCHC 32.9 02/10/2023 1136   RDW 12.7 02/10/2023 1136   LYMPHSABS 1.6 01/23/2007 0912   MONOABS 0.2 01/23/2007 0912   EOSABS 0.0 01/23/2007 0912   BASOSABS 0.0 01/23/2007 0912    BMET    Component Value Date/Time   NA 139 02/10/2023 1136   K 4.3 02/10/2023 1136   CL 104 02/10/2023 1136   CO2 29 02/10/2023 1136   GLUCOSE 80 02/10/2023 1136   BUN 12 02/10/2023 1136   CREATININE 0.95 02/10/2023 1136   CALCIUM 9.5 02/10/2023 1136   GFRNONAA >60 02/10/2023 1136   GFRAA >60 08/17/2014 0409    INR    Component Value Date/Time   INR 0.92 08/10/2014 1350     Intake/Output Summary (Last 24 hours) at 02/15/2023 1044 Last data filed at 02/15/2023 0710 Gross per 24 hour  Intake 2120 ml  Output 200 ml  Net 1920 ml     Assessment:  68 y.o. female is s/p exposure for alif  Plan: Ok for [employer] from vascular standpoint   Jaquelynn Wanamaker C. Randie Heinz, MD [facility] [place] Office: [phone] Pager: [phone]  02/15/2023 10:44 AM

## 2023-02-15 NOTE — Discharge Summary (Signed)
Patient ID: Margaret Hancock MRN: [id number] DOB/AGE: [birth year] 68 y.o.  Admit date: 02/14/2023 Discharge date: 02/15/2023  Admission Diagnoses: Lumbar adjacent segment disease with spondylolisthesis [M51.369, M43.16]   Discharge Diagnoses: Same   Discharged Condition: Stable  Hospital Course:  Margaret Hancock is a 68 y.o. female who was admitted following an uncomplicated L5-S1 ALIF. They were recovered in PACU and transferred to the floor. Hospital course was uncomplicated. Pt stable for discharge today. Pt to f/u in office for routine post op visit. Pt is in agreement w/ plan.    Discharge Exam: Blood pressure (!) 125/59, pulse 82, temperature 98.6 F (37 C), temperature source Oral, resp. rate 20, height 5\' 2"  (1.575 m), weight 82.6 kg, SpO2 96%. A&O Speech fluent, appropriate Strength grossly intact BUE/BLE.  SILTx4.  Dressing c/d/I.   Disposition: Discharge disposition: 01-Home or Self Care        Allergies as of 02/15/2023       Reactions   Oxycodone Other (See Comments)   Patient states it makes her mean   Nitrous Oxide Nausea Only, Other (See Comments)        Medication List     STOP taking these medications    celecoxib 200 MG capsule Commonly known as: CELEBREX       TAKE these medications    acetaminophen 500 MG tablet Commonly known as: TYLENOL Take 1,000 mg by mouth every 6 (six) hours as needed for moderate pain (pain score 4-6).   amLODipine 10 MG tablet Commonly known as: NORVASC Take 10 mg by mouth every evening.   CALCIUM 600 + D PO Take 1 capsule by mouth every evening.   Cequa 0.09 % Soln Generic drug: cycloSPORINE (PF) Place 1 drop into both eyes 2 (two) times daily.   Co Q 10 100 MG Caps Take 100 mg by mouth every evening.   COLLAGEN PO Take 10 g by mouth daily. Powder collagen   CRANBERRY PO Take 1 capsule by mouth every evening.   cyanocobalamin 1000 MCG tablet Commonly known as: VITAMIN B12 Take 1,000 mcg by  mouth every evening.   doxylamine (Sleep) 25 MG tablet Commonly known as: UNISOM Take 25 mg by mouth at bedtime.   esomeprazole 20 MG capsule Commonly known as: NEXIUM Take 20 mg by mouth every evening.   Fish Oil Ultra 1400 MG Caps Take 1,400 mg by mouth daily.   hydrochlorothiazide 25 MG tablet Commonly known as: HYDRODIURIL Take 25 mg by mouth daily.   HYDROcodone-acetaminophen 10-325 MG tablet Commonly known as: NORCO Take 1 tablet by mouth every 4 (four) hours as needed for moderate pain (pain score 4-6) ((score 4 to 6)).   levothyroxine 88 MCG tablet Commonly known as: SYNTHROID Take 88 mcg by mouth daily.   lisinopril 10 MG tablet Commonly known as: ZESTRIL Take 10 mg by mouth every evening.   methocarbamol 750 MG tablet Commonly known as: Robaxin-750 Take 1 tablet (750 mg total) by mouth 4 (four) times daily.   miconazole 2 % powder Commonly known as: MICOTIN Apply 1 Application topically 2 (two) times daily.   Mounjaro 15 MG/0.5ML Pen Generic drug: tirzepatide Inject 15 mg into the skin once a week.   MULTIVITAMIN ADULT PO Take 1 tablet by mouth daily.   OSTEO BI-FLEX REGULAR STRENGTH PO Take 1 tablet by mouth daily.   Prevagen 10 MG Caps Generic drug: Apoaequorin Take 10 mg by mouth daily.   rosuvastatin 20 MG tablet Commonly known as: CRESTOR Take 20  mg by mouth every evening.   senna-docusate 8.6-50 MG tablet Commonly known as: Senokot-S Take 1 tablet by mouth 2 (two) times daily.               Durable Medical Equipment  (From admission, onward)           Start     Ordered   02/14/23 1156  DME Walker rolling  Once       Question:  Patient needs a walker to treat with the following condition  Answer:  Lumbar adjacent segment disease with spondylolisthesis   02/14/23 1155   02/14/23 1156  DME 3 n 1  Once        02/14/23 1155            Follow-up Information     Julio Sicks, MD. Call.   Specialty: Neurosurgery Why: As  needed, If symptoms worsen Contact information: [address] [place] [address] [phone]                 Signed: Clovis Riley 02/15/2023, 8:33 AM

## 2023-02-15 NOTE — Progress Notes (Signed)
Patient is discharged from room 3C06 at this time. Alert and in stable condition. IV site d/c'd and instructions read to patient and spouse with understanding verbalized and all questions answered. Left unit via wheelchair with all belongings at side 

## 2023-02-15 NOTE — Evaluation (Signed)
Physical Therapy Evaluation Patient Details Name: Margaret Hancock MRN: [id number] DOB: 17-Feb-1955 Today's Date: 02/15/2023  History of Present Illness  Pt is a 68 y.o. female presenting 1/31 s/p L5-S1 anterior lumbar interbody fusion. PMH: HLD, HTN, OA, obesity (BMI 33), x2 prior lumbar fusions, and L THA.   Clinical Impression  Pt in bed upon arrival of PT, agreeable to evaluation at this time. Prior to admission the pt was completely independent without need for DME and reports no recent falls. The pt lives with her spouse in a home with 1 step to enter but a flight of stairs to get to her "cat room" where she takes care of her animals. She is hopeful to return to independence with stairs, taking care of her cats, and walking without a limp. The pt was able to complete bed mobility with cues for log roll, sit-stand transfers, hallway ambulation, and stairs without need for assistance or DME. She does continue to ambulate with antalgic pattern with decreased wt shift to LLE and short strides on RLE, she is able to improve with cues but unable to maintain. Will benefit from OPPT once cleared by MD to progress strength and gait to normal. Pt educated on spinal precautions, use of brace, progressive walking program, and car transfers with pt reporting understanding, no further acute PT needs identified at this time. Thank you for the consult.             If plan is discharge home, recommend the following: A little help with walking and/or transfers;Help with stairs or ramp for entrance   Can travel by private vehicle        Equipment Recommendations None recommended by PT  Recommendations for Other Services       Functional Status Assessment Patient has had a recent decline in their functional status and demonstrates the ability to make significant improvements in function in a reasonable and predictable amount of time.     Precautions / Restrictions Precautions Precautions: Back Precaution  Booklet Issued: Yes (comment) Precaution Comments: All precautions reviewed within the context of ADL and handout provided; pt with good demo of understanding Required Braces or Orthoses: Spinal Brace Spinal Brace: Lumbar corset;Applied in sitting position Restrictions Weight Bearing Restrictions Per Provider Order: No      Mobility  Bed Mobility Overal bed mobility: Modified Independent             General bed mobility comments: pt OOB at start and end of session    Transfers Overall transfer level: Modified independent Equipment used: None               General transfer comment: for STS    Ambulation/Gait Ambulation/Gait assistance: Supervision Gait Distance (Feet): 400 Feet Assistive device: None Gait Pattern/deviations: Step-through pattern, Decreased stride length, Decreased stance time - left, Decreased step length - right, Decreased weight shift to left Gait velocity: decreased Gait velocity interpretation: <1.31 ft/sec, indicative of household ambulator   General Gait Details: pt with both knees slightly flexed, antalgic gait with limited wt on LLE, pt reports more of a learned gait pattern than due to pain today. cues for increased stride length  Stairs Stairs: Yes Stairs assistance: Contact guard assist Stair Management: One rail Right, Step to pattern, Forwards Number of Stairs: 2 General stair comments: educated on up with good and down with bad, pt is able to manage alternating today but limited by pain     Balance Overall balance assessment: Needs assistance Sitting-balance support:  No upper extremity supported, Feet supported Sitting balance-Leahy Scale: Good     Standing balance support: No upper extremity supported, During functional activity Standing balance-Leahy Scale: Fair Standing balance comment: supervision for safety dynamically and for longer distance                             Pertinent Vitals/Pain Pain  Assessment Pain Assessment: Faces Faces Pain Scale: Hurts even more Pain Location: surgical site. LLE numbness Pain Descriptors / Indicators: Discomfort, Guarding Pain Intervention(s): Limited activity within patient's tolerance, Monitored during session, Repositioned    Home Living Family/patient expects to be discharged to:: Private residence Living Arrangements: Spouse/significant other Available Help at Discharge: Family Type of Home: House Home Access: Stairs to enter   Secretary/administrator of Steps: 1 (threshold)   Home Layout: One level Home Equipment: Shower seat - built Charity fundraiser (2 wheels) Additional Comments: also has walking stick    Prior Function Prior Level of Function : Independent/Modified Independent;Driving             Mobility Comments: independent with use of walking stick for outside ambulation. 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 grossly 4/5 to MMT LLE Sensation: decreased light touch LLE Coordination: WNL    Cervical / Trunk Assessment Cervical / Trunk Assessment: Back Surgery;Other exceptions Cervical / Trunk Exceptions: large body habitus  Communication   Communication Communication: No apparent difficulties  Cognition Arousal: Alert Behavior During Therapy: WFL for tasks assessed/performed Overall Cognitive Status: Within Functional Limits for tasks assessed                                          General Comments General comments (skin integrity, edema, etc.): VSS on RA    Exercises Other Exercises Other Exercises: standing lunge with back straight, pt with partial ROM, good  stability with single UE support, repeated x3 working on ROM   Assessment/Plan    PT Assessment Patient does not need any further PT services         PT Goals (Current goals can be found in the Care Plan section)  Acute Rehab PT Goals Patient Stated Goal: return to normal gait pattern without limp PT Goal Formulation: With patient Time For Goal Achievement: 03/01/23 Potential to Achieve Goals: Good     AM-PAC PT "6 Clicks" Mobility  Outcome Measure Help needed turning from your back to your side while in a flat bed without using bedrails?: A Little Help needed moving from lying on your back to sitting on the side of a flat bed without using bedrails?: A Little Help needed moving to and from a bed to a chair (including a wheelchair)?: None Help needed standing up from a chair using your arms (e.g., wheelchair or bedside chair)?: None Help needed to walk in hospital room?: A Little Help needed climbing 3-5 steps with a railing? : A Little 6 Click Score: 20    End of Session Equipment Utilized During Treatment: Gait belt;Back brace Activity Tolerance: Patient tolerated treatment well Patient left: in chair;with call  bell/phone within reach;with family/visitor present Nurse Communication: Mobility status PT Visit Diagnosis: Pain;Muscle weakness (generalized) (M62.81) Pain - Right/Left: Left Pain - part of body: Hip;Leg    Time: 1610-9604 PT Time Calculation (min) (ACUTE ONLY): 23 min   Charges:   PT Evaluation $PT Eval Low Complexity: 1 Low PT Treatments $Self Care/Home Management: 8-22 PT General Charges $$ ACUTE PT VISIT: 1 Visit         Vickki Muff, PT, DPT   Acute Rehabilitation Department Office [phone] Secure Chat Communication Preferred  Ronnie Derby 02/15/2023, 11:29 AM

## 2023-02-15 NOTE — Care Management (Signed)
 Patient with order to DC to home today. Unit staff to provide DME needed for home.   No HH needs identified Patient will have family/ friends provide transportation home. No other TOC needs identified for DC

## 2023-02-15 NOTE — Evaluation (Signed)
Occupational Therapy Evaluation Patient Details Name: Margaret Hancock MRN: [id number] DOB: [birth year] Today's Date: 02/15/2023   History of Present Illness Pt is a 68 y.o. female presenting 1/31 s/p L5-S1 anterior lumbar interbody fusion. PMH: HLD, HTN, OA, obesity (BMI 33), x2 prior lumbar fusions, and L THA.   Clinical Impression   PTA, pt lived with husband and was mod I in ADL and IADL. Pt reports recently being limited in standing tolerance due to back pain. Upon eval, pt performing UB ADL with set-up/supervision and LB ADL with supervision. Pt educated and demonstrating use of compensatory techniques for bed mobility, brace application, LB ADL, grooming, toileting, and shower transfers within precautions. All education provided and questions answered. Recommending discharge home with no OT follow up at this time. OT to sign off. Please re-consult if change in status.         If plan is discharge home, recommend the following: A little help with bathing/dressing/bathroom;A little help with walking and/or transfers;Assistance with cooking/housework;Assist for transportation;Help with stairs or ramp for entrance    Functional Status Assessment  Patient has had a recent decline in their functional status and demonstrates the ability to make significant improvements in function in a reasonable and predictable amount of time.  Equipment Recommendations  None recommended by OT    Recommendations for Other Services       Precautions / Restrictions Precautions Precautions: Back Precaution Booklet Issued: Yes (comment) Precaution Comments: All precautions reviewed within the context of ADL and handout provided; pt with good demo of understanding Required Braces or Orthoses: Spinal Brace Spinal Brace: Lumbar corset;Applied in sitting position Restrictions Weight Bearing Restrictions Per Provider Order: No      Mobility Bed Mobility Overal bed mobility: Modified Independent              General bed mobility comments: good use of log roll technique to come to EOB after initial education    Transfers Overall transfer level: Modified independent                 General transfer comment: for STS      Balance Overall balance assessment: Needs assistance Sitting-balance support: No upper extremity supported, Feet supported Sitting balance-Leahy Scale: Good     Standing balance support: No upper extremity supported, During functional activity Standing balance-Leahy Scale: Fair Standing balance comment: supervision for safety dynamically and for longer distance                           ADL either performed or assessed with clinical judgement   ADL Overall ADL's : Needs assistance/impaired Eating/Feeding: Independent   Grooming: Modified independent;Standing Grooming Details (indicate cue type and reason): adhering to spinal precautions Upper Body Bathing: Modified independent;Sitting   Lower Body Bathing: Modified independent;Sit to/from stand   Upper Body Dressing : Supervision/safety;Sitting;Standing Upper Body Dressing Details (indicate cue type and reason): min cues during new learning for optimal brace donning techniques Lower Body Dressing: Supervision/safety Lower Body Dressing Details (indicate cue type and reason): distant supervision approaching mod I after initial education Toilet Transfer: Supervision/safety           Functional mobility during ADLs: Supervision/safety       Vision Baseline Vision/History: 1 Wears glasses Ability to See in Adequate Light: 0 Adequate Patient Visual Report: No change from baseline Vision Assessment?: No apparent visual deficits     Perception Perception: Not tested       Praxis Praxis:  Not tested       Pertinent Vitals/Pain Pain Assessment Pain Assessment: Faces Faces Pain Scale: Hurts little more Pain Location: surgical site. LLE numbness Pain Descriptors / Indicators:  Discomfort, Guarding Pain Intervention(s): Limited activity within patient's tolerance, Monitored during session     Extremity/Trunk Assessment Upper Extremity Assessment Upper Extremity Assessment: Overall WFL for tasks assessed   Lower Extremity Assessment Lower Extremity Assessment: Defer to PT evaluation   Cervical / Trunk Assessment Cervical / Trunk Assessment: Back Surgery   Communication Communication Communication: No apparent difficulties   Cognition Arousal: Alert Behavior During Therapy: WFL for tasks assessed/performed Overall Cognitive Status: Within Functional Limits for tasks assessed                                       General Comments  VSS    Exercises     Shoulder Instructions      Home Living Family/patient expects to be discharged to:: Private residence Living Arrangements: Spouse/significant other Available Help at Discharge: Family Type of Home: House Home Access: Stairs to enter Secretary/administrator of Steps: 1 (threshold)   Home Layout: One level     Bathroom Shower/Tub: Producer, television/film/video: Standard     Home Equipment: Shower seat - built Charity fundraiser (2 wheels)   Additional Comments: also has walking stick      Prior Functioning/Environment Prior Level of Function : Independent/Modified Independent;Driving               ADLs Comments: 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        OT Problem List: Decreased strength;Decreased activity tolerance;Impaired balance (sitting and/or standing);Decreased knowledge of precautions      OT Treatment/Interventions:      OT Goals(Current goals can be found in the care plan section) Acute Rehab OT Goals Patient Stated Goal: go home OT Goal Formulation: With patient Time For Goal Achievement: 03/01/23 Potential to Achieve Goals: Good  OT Frequency:      Co-evaluation              AM-PAC OT "6 Clicks" Daily Activity     Outcome  Measure Help from another person eating meals?: None Help from another person taking care of personal grooming?: None Help from another person toileting, which includes using toliet, bedpan, or urinal?: A Little Help from another person bathing (including washing, rinsing, drying)?: A Little Help from another person to put on and taking off regular upper body clothing?: A Little Help from another person to put on and taking off regular lower body clothing?: A Little 6 Click Score: 20   End of Session Equipment Utilized During Treatment: Back brace Nurse Communication: Mobility status  Activity Tolerance: Patient tolerated treatment well Patient left: in chair;with call bell/phone within reach  OT Visit Diagnosis: Unsteadiness on feet (R26.81);Muscle weakness (generalized) (M62.81);Pain Pain - part of body:  (abdominal incision, back, LLE)                Time: 7829-5621 OT Time Calculation (min): 28 min Charges:  OT General Charges $OT Visit: 1 Visit OT Evaluation $OT Eval Low Complexity: 1 Low OT Treatments $Self Care/Home Management : 8-22 mins  Tyler Deis, OTR/L [facility] Acute Rehabilitation Office: [phone]   Myrla Halsted 02/15/2023, 9:43 AM

## 2023-02-17 MED FILL — Heparin Sodium (Porcine) Inj 1000 Unit/ML: INTRAMUSCULAR | Qty: 30 | Status: AC

## 2023-03-18 DIAGNOSIS — M4310 Spondylolisthesis, site unspecified: Secondary | ICD-10-CM | POA: Diagnosis not present

## 2023-03-28 ENCOUNTER — Other Ambulatory Visit (HOSPITAL_BASED_OUTPATIENT_CLINIC_OR_DEPARTMENT_OTHER): Payer: Self-pay

## 2023-04-03 DIAGNOSIS — M961 Postlaminectomy syndrome, not elsewhere classified: Secondary | ICD-10-CM | POA: Diagnosis not present

## 2023-04-03 DIAGNOSIS — M5416 Radiculopathy, lumbar region: Secondary | ICD-10-CM | POA: Diagnosis not present

## 2023-04-04 DIAGNOSIS — Z0000 Encounter for general adult medical examination without abnormal findings: Secondary | ICD-10-CM | POA: Diagnosis not present

## 2023-04-04 DIAGNOSIS — E785 Hyperlipidemia, unspecified: Secondary | ICD-10-CM | POA: Diagnosis not present

## 2023-04-04 DIAGNOSIS — Z1331 Encounter for screening for depression: Secondary | ICD-10-CM | POA: Diagnosis not present

## 2023-04-04 DIAGNOSIS — E88818 Other insulin resistance: Secondary | ICD-10-CM | POA: Diagnosis not present

## 2023-04-04 DIAGNOSIS — E039 Hypothyroidism, unspecified: Secondary | ICD-10-CM | POA: Diagnosis not present

## 2023-04-04 DIAGNOSIS — R911 Solitary pulmonary nodule: Secondary | ICD-10-CM | POA: Diagnosis not present

## 2023-04-04 DIAGNOSIS — R931 Abnormal findings on diagnostic imaging of heart and coronary circulation: Secondary | ICD-10-CM | POA: Diagnosis not present

## 2023-04-04 DIAGNOSIS — I10 Essential (primary) hypertension: Secondary | ICD-10-CM | POA: Diagnosis not present

## 2023-04-04 DIAGNOSIS — R739 Hyperglycemia, unspecified: Secondary | ICD-10-CM | POA: Diagnosis not present

## 2023-04-04 DIAGNOSIS — G4733 Obstructive sleep apnea (adult) (pediatric): Secondary | ICD-10-CM | POA: Diagnosis not present

## 2023-04-09 ENCOUNTER — Other Ambulatory Visit: Payer: Self-pay | Admitting: Family Medicine

## 2023-04-09 DIAGNOSIS — R911 Solitary pulmonary nodule: Secondary | ICD-10-CM

## 2023-04-14 ENCOUNTER — Ambulatory Visit
Admission: RE | Admit: 2023-04-14 | Discharge: 2023-04-14 | Disposition: A | Discharge status: Home / Self Care | Source: Ambulatory Visit | Attending: Family Medicine | Admitting: Family Medicine

## 2023-04-14 DIAGNOSIS — R911 Solitary pulmonary nodule: Secondary | ICD-10-CM

## 2023-04-16 DIAGNOSIS — Z808 Family history of malignant neoplasm of other organs or systems: Secondary | ICD-10-CM | POA: Diagnosis not present

## 2023-04-16 DIAGNOSIS — L658 Other specified nonscarring hair loss: Secondary | ICD-10-CM | POA: Diagnosis not present

## 2023-04-16 DIAGNOSIS — Z411 Encounter for cosmetic surgery: Secondary | ICD-10-CM | POA: Diagnosis not present

## 2023-04-16 DIAGNOSIS — L821 Other seborrheic keratosis: Secondary | ICD-10-CM | POA: Diagnosis not present

## 2023-04-16 DIAGNOSIS — L578 Other skin changes due to chronic exposure to nonionizing radiation: Secondary | ICD-10-CM | POA: Diagnosis not present

## 2023-04-16 DIAGNOSIS — L814 Other melanin hyperpigmentation: Secondary | ICD-10-CM | POA: Diagnosis not present

## 2023-04-16 DIAGNOSIS — L650 Telogen effluvium: Secondary | ICD-10-CM | POA: Diagnosis not present

## 2023-04-16 DIAGNOSIS — D225 Melanocytic nevi of trunk: Secondary | ICD-10-CM | POA: Diagnosis not present

## 2023-04-29 DIAGNOSIS — M4310 Spondylolisthesis, site unspecified: Secondary | ICD-10-CM | POA: Diagnosis not present

## 2023-05-04 DIAGNOSIS — R300 Dysuria: Secondary | ICD-10-CM | POA: Diagnosis not present

## 2023-05-04 DIAGNOSIS — N390 Urinary tract infection, site not specified: Secondary | ICD-10-CM | POA: Diagnosis not present

## 2023-05-04 DIAGNOSIS — R319 Hematuria, unspecified: Secondary | ICD-10-CM | POA: Diagnosis not present

## 2023-05-22 ENCOUNTER — Other Ambulatory Visit: Payer: Self-pay

## 2023-05-22 MED ORDER — ROSUVASTATIN CALCIUM 20 MG PO TABS: 20.0000 mg | ORAL_TABLET | Freq: Every evening | ORAL | 0 refills | Status: DC

## 2023-05-27 ENCOUNTER — Encounter: Payer: Self-pay | Admitting: Cardiology

## 2023-06-18 DIAGNOSIS — G8929 Other chronic pain: Secondary | ICD-10-CM | POA: Diagnosis not present

## 2023-06-18 DIAGNOSIS — M25561 Pain in right knee: Secondary | ICD-10-CM | POA: Diagnosis not present

## 2023-06-19 ENCOUNTER — Other Ambulatory Visit (HOSPITAL_BASED_OUTPATIENT_CLINIC_OR_DEPARTMENT_OTHER): Payer: Self-pay

## 2023-06-24 ENCOUNTER — Other Ambulatory Visit: Payer: Self-pay | Admitting: *Deleted

## 2023-06-24 MED ORDER — ROSUVASTATIN CALCIUM 20 MG PO TABS: 20.0000 mg | ORAL_TABLET | Freq: Every evening | ORAL | 0 refills | Status: DC

## 2023-06-26 DIAGNOSIS — E785 Hyperlipidemia, unspecified: Secondary | ICD-10-CM | POA: Insufficient documentation

## 2023-06-26 NOTE — Progress Notes (Signed)
  Cardiology Office Note:   Date:  06/27/2023  ID:  NIDIA GROGAN, DOB [birth year], MRN [id number] PCP: Bufford Carne, FNP  [facility] Providers Cardiologist:  None {  History of Present Illness:   Margaret Hancock is a 68 y.o. female who presents for evaluation of an elevated coronary calcium  score.  This was 174 which was 57th percentile.I saw her in 2024.   She has had no new cardiovascular complaints.  She does not exercise routinely but she does do a lot of walking.  She has a home in [place] and lives there part of the time and walks a lot when she is there.  [place].  She denies any chest pressure, neck or arm discomfort.  She has not had any new shortness of breath, PND or orthopnea.  She has had no palpitations, presyncope or syncope.   ROS: As stated in the HPI and negative for all other systems.  Studies Reviewed:    EKG:   EKG Interpretation Date/Time:  Friday June 27 2023 08:57:53 EDT Ventricular Rate:  68 PR Interval:  168 QRS Duration:  92 QT Interval:  418 QTC Calculation: 444 R Axis:   22  Text Interpretation: Normal sinus rhythm Poor anterior R wave progression No significant change since last tracing 2024 Confirmed by Eilleen Grates ([id number]) on 06/27/2023 9:19:58 AM     Risk Assessment/Calculations:              Physical Exam:   VS:  BP 137/85 (BP Location: Left Arm, Patient Position: Sitting, Cuff Size: Normal)   Pulse 70   Ht 5' 2 (1.575 m)   Wt 177 lb (80.3 kg)   SpO2 98%   BMI 32.37 kg/m    Wt Readings from Last 3 Encounters:  06/27/23 177 lb (80.3 kg)  02/14/23 182 lb (82.6 kg)  02/10/23 183 lb 6.4 oz (83.2 kg)     GEN: Well nourished, well developed in no acute distress NECK: No JVD; No carotid bruits CARDIAC: RRR, no murmurs, rubs, gallops RESPIRATORY:  Clear to auscultation without rales, wheezing or rhonchi  ABDOMEN: Soft, non-tender, non-distended EXTREMITIES:  No edema; No deformity   ASSESSMENT AND  PLAN:   Elevated coronary calcium : We have long discussion again about this.  She has no symptoms.  No further testing is indicated.  Dyslipidemia:  LDL was was not at target at 89 in March of this year.  I like the LDL to be in the 50s.  I am getting increase of Crestor  to 40 mg daily and check a lipid profile in 3 months.      Follow up with me in two years.   Signed, Eilleen Grates, MD

## 2023-06-27 ENCOUNTER — Ambulatory Visit: Attending: Cardiology | Admitting: Cardiology

## 2023-06-27 ENCOUNTER — Encounter: Payer: Self-pay | Admitting: Cardiology

## 2023-06-27 VITALS — BP 137/85 | HR 70 | Ht 62.0 in | Wt 177.0 lb

## 2023-06-27 DIAGNOSIS — E785 Hyperlipidemia, unspecified: Secondary | ICD-10-CM | POA: Diagnosis not present

## 2023-06-27 DIAGNOSIS — R931 Abnormal findings on diagnostic imaging of heart and coronary circulation: Secondary | ICD-10-CM | POA: Diagnosis not present

## 2023-06-27 MED ORDER — ROSUVASTATIN CALCIUM 40 MG PO TABS: 40.0000 mg | ORAL_TABLET | Freq: Every day | ORAL | 3 refills | Status: AC

## 2023-06-27 MED ORDER — ROSUVASTATIN CALCIUM 20 MG PO TABS: 20.0000 mg | ORAL_TABLET | Freq: Every evening | ORAL | 3 refills | Status: DC

## 2023-06-27 NOTE — Patient Instructions (Addendum)
 Medication Instructions:  Increase Crestor  to 40 mg once daily *If you need a refill on your cardiac medications before your next appointment, please call your pharmacy*  Lab Work: Fasting lipid panel in 3 months If you have labs (blood work) drawn today and your tests are completely normal, you will receive your results only by: MyChart Message (if you have MyChart) OR A paper copy in the mail If you have any lab test that is abnormal or we need to change your treatment, we will call you to review the results.  Testing/Procedures: NONE  Follow-Up: At [facility], you and your health needs are our priority.  As part of our continuing mission to provide you with exceptional heart care, our providers are all part of one team.  This team includes your primary Cardiologist (physician) and Advanced Practice Providers or APPs (Physician Assistants and Nurse Practitioners) who all work together to provide you with the care you need, when you need it.  Your next appointment:   2 years  Provider:   Lavonne Prairie, MD  We recommend signing up for the patient portal called MyChart.  Sign up information is provided on this After Visit Summary.  MyChart is used to connect with patients for Virtual Visits (Telemedicine).  Patients are able to view lab/test results, encounter notes, upcoming appointments, etc.  Non-urgent messages can be sent to your provider as well.   To learn more about what you can do with MyChart, go to [email or web address].

## 2023-07-07 ENCOUNTER — Other Ambulatory Visit: Payer: Self-pay | Admitting: Family Medicine

## 2023-07-07 DIAGNOSIS — Z1231 Encounter for screening mammogram for malignant neoplasm of breast: Secondary | ICD-10-CM

## 2023-07-24 DIAGNOSIS — M1711 Unilateral primary osteoarthritis, right knee: Secondary | ICD-10-CM | POA: Diagnosis not present

## 2023-07-30 DIAGNOSIS — M4310 Spondylolisthesis, site unspecified: Secondary | ICD-10-CM | POA: Diagnosis not present

## 2023-07-31 DIAGNOSIS — G8929 Other chronic pain: Secondary | ICD-10-CM | POA: Diagnosis not present

## 2023-07-31 DIAGNOSIS — M1711 Unilateral primary osteoarthritis, right knee: Secondary | ICD-10-CM | POA: Diagnosis not present

## 2023-07-31 DIAGNOSIS — M25561 Pain in right knee: Secondary | ICD-10-CM | POA: Diagnosis not present

## 2023-08-07 DIAGNOSIS — M1711 Unilateral primary osteoarthritis, right knee: Secondary | ICD-10-CM | POA: Diagnosis not present

## 2023-08-18 ENCOUNTER — Encounter (HOSPITAL_BASED_OUTPATIENT_CLINIC_OR_DEPARTMENT_OTHER): Payer: Self-pay

## 2023-08-18 ENCOUNTER — Ambulatory Visit (HOSPITAL_BASED_OUTPATIENT_CLINIC_OR_DEPARTMENT_OTHER)
Admission: RE | Admit: 2023-08-18 | Discharge: 2023-08-18 | Disposition: A | Discharge status: Home / Self Care | Source: Ambulatory Visit | Attending: Family Medicine | Admitting: Family Medicine

## 2023-08-18 ENCOUNTER — Ambulatory Visit

## 2023-08-18 DIAGNOSIS — Z1231 Encounter for screening mammogram for malignant neoplasm of breast: Secondary | ICD-10-CM | POA: Insufficient documentation

## 2023-09-19 DIAGNOSIS — E785 Hyperlipidemia, unspecified: Secondary | ICD-10-CM | POA: Diagnosis not present

## 2023-09-20 LAB — LIPID PANEL
Chol/HDL Ratio: 2.6 ratio (ref 0.0–4.4)
Cholesterol, Total: 166 mg/dL (ref 100–199)
HDL: 64 mg/dL (ref >39)
LDL Chol Calc (NIH): 77 mg/dL (ref 0–99)
Triglycerides: 145 mg/dL (ref 0–149)
VLDL Cholesterol Cal: 25 mg/dL (ref 5–40)

## 2023-09-24 ENCOUNTER — Ambulatory Visit: Payer: Self-pay | Admitting: Cardiology

## 2023-09-24 DIAGNOSIS — E785 Hyperlipidemia, unspecified: Secondary | ICD-10-CM

## 2023-09-26 MED ORDER — EZETIMIBE 10 MG PO TABS: 10.0000 mg | ORAL_TABLET | Freq: Every day | ORAL | 3 refills | Status: AC

## 2023-09-26 NOTE — Telephone Encounter (Signed)
 Spoke with pt regarding her results and Dr. Denver suggestions. Pt agreeable. Prescription sent. Lab ordered and released. Pt verbalized understanding. All questions if any were answered.

## 2023-09-26 NOTE — Telephone Encounter (Signed)
-----   Message from Lynwood Schilling sent at 09/24/2023  8:46 AM EDT ----- Her lipids are close but not quite at target.  I would like to add Zetia  10 mg daily p.o.  This should give us  a target.  It is cheap and usually very well-tolerated.  Repeat a lipid profile in 3  months. ----- Message ----- From: Interface, [facility] Lab Results In Sent: 09/20/2023  12:35 AM EDT To: Lynwood Schilling, MD

## 2023-11-18 DIAGNOSIS — Z833 Family history of diabetes mellitus: Secondary | ICD-10-CM | POA: Diagnosis not present

## 2023-11-18 DIAGNOSIS — E669 Obesity, unspecified: Secondary | ICD-10-CM | POA: Diagnosis not present

## 2023-11-18 DIAGNOSIS — Z8249 Family history of ischemic heart disease and other diseases of the circulatory system: Secondary | ICD-10-CM | POA: Diagnosis not present

## 2023-11-18 DIAGNOSIS — E785 Hyperlipidemia, unspecified: Secondary | ICD-10-CM | POA: Diagnosis not present

## 2023-11-18 DIAGNOSIS — I10 Essential (primary) hypertension: Secondary | ICD-10-CM | POA: Diagnosis not present

## 2023-11-18 DIAGNOSIS — E039 Hypothyroidism, unspecified: Secondary | ICD-10-CM | POA: Diagnosis not present

## 2023-11-18 DIAGNOSIS — Z823 Family history of stroke: Secondary | ICD-10-CM | POA: Diagnosis not present

## 2023-11-18 DIAGNOSIS — M1990 Unspecified osteoarthritis, unspecified site: Secondary | ICD-10-CM | POA: Diagnosis not present

## 2023-11-18 DIAGNOSIS — I2510 Atherosclerotic heart disease of native coronary artery without angina pectoris: Secondary | ICD-10-CM | POA: Diagnosis not present

## 2023-11-19 ENCOUNTER — Other Ambulatory Visit (HOSPITAL_BASED_OUTPATIENT_CLINIC_OR_DEPARTMENT_OTHER): Payer: Self-pay

## 2023-11-19 MED ORDER — MOUNJARO 15 MG/0.5ML ~~LOC~~ SOAJ
15.0000 mg | SUBCUTANEOUS | 3 refills | Status: AC
  Filled 2023-11-19 – 2023-11-30 (×2): qty 6, 84d supply, fill #0

## 2023-11-28 DIAGNOSIS — M25561 Pain in right knee: Secondary | ICD-10-CM | POA: Diagnosis not present

## 2023-11-28 DIAGNOSIS — M1711 Unilateral primary osteoarthritis, right knee: Secondary | ICD-10-CM | POA: Diagnosis not present

## 2023-12-01 ENCOUNTER — Other Ambulatory Visit (HOSPITAL_BASED_OUTPATIENT_CLINIC_OR_DEPARTMENT_OTHER): Payer: Self-pay

## 2023-12-01 DIAGNOSIS — M79641 Pain in right hand: Secondary | ICD-10-CM | POA: Diagnosis not present

## 2024-02-06 LAB — LIPID PANEL
Chol/HDL Ratio: 2 ratio (ref 0.0–4.4)
Cholesterol, Total: 140 mg/dL (ref 100–199)
HDL: 69 mg/dL
LDL Chol Calc (NIH): 57 mg/dL (ref 0–99)
Triglycerides: 71 mg/dL (ref 0–149)
VLDL Cholesterol Cal: 14 mg/dL (ref 5–40)
